# Patient Record
Sex: Female | Born: 1960 | Race: Black or African American | Hispanic: No | Marital: Single | Smoking: Never smoker
Health system: Southern US, Community
[De-identification: ages and names within clinical notes are randomized; demographics above are authoritative.]

## PROBLEM LIST (undated history)

## (undated) DIAGNOSIS — D509 Iron deficiency anemia, unspecified: Secondary | ICD-10-CM

## (undated) HISTORY — DX: Iron deficiency anemia, unspecified: D50.9

---

## 1998-12-18 ENCOUNTER — Other Ambulatory Visit: Admission: RE | Admit: 1998-12-18 | Discharge: 1998-12-18 | Payer: Self-pay | Admitting: Obstetrics and Gynecology

## 1999-02-15 ENCOUNTER — Ambulatory Visit (HOSPITAL_COMMUNITY): Admission: RE | Admit: 1999-02-15 | Discharge: 1999-02-15 | Payer: Self-pay | Admitting: Obstetrics and Gynecology

## 2000-01-13 ENCOUNTER — Other Ambulatory Visit: Admission: RE | Admit: 2000-01-13 | Discharge: 2000-01-13 | Payer: Self-pay | Admitting: Obstetrics and Gynecology

## 2001-02-07 ENCOUNTER — Other Ambulatory Visit: Admission: RE | Admit: 2001-02-07 | Discharge: 2001-02-07 | Payer: Self-pay | Admitting: Obstetrics and Gynecology

## 2001-06-20 ENCOUNTER — Ambulatory Visit (HOSPITAL_COMMUNITY): Admission: RE | Admit: 2001-06-20 | Discharge: 2001-06-20 | Payer: Self-pay | Admitting: Internal Medicine

## 2001-06-20 ENCOUNTER — Encounter: Payer: Self-pay | Admitting: Internal Medicine

## 2002-03-26 ENCOUNTER — Other Ambulatory Visit: Admission: RE | Admit: 2002-03-26 | Discharge: 2002-03-26 | Payer: Self-pay | Admitting: Obstetrics and Gynecology

## 2003-04-24 ENCOUNTER — Other Ambulatory Visit: Admission: RE | Admit: 2003-04-24 | Discharge: 2003-04-24 | Payer: Self-pay | Admitting: Obstetrics and Gynecology

## 2004-07-09 ENCOUNTER — Other Ambulatory Visit: Admission: RE | Admit: 2004-07-09 | Discharge: 2004-07-09 | Payer: Self-pay | Admitting: Obstetrics and Gynecology

## 2012-02-10 ENCOUNTER — Telehealth: Payer: Self-pay | Admitting: Hematology and Oncology

## 2012-02-10 NOTE — Telephone Encounter (Signed)
C/D 02/10/12 for appt. 02/14/12

## 2012-02-10 NOTE — Telephone Encounter (Signed)
S/W pt in re NP appt 10/08 @ 1 w/ Dr. Dalene Carrow.  Referring Dr. Richarda Overlie Dx-Elevate and treat anemia NP emailed.

## 2012-02-14 ENCOUNTER — Ambulatory Visit (HOSPITAL_BASED_OUTPATIENT_CLINIC_OR_DEPARTMENT_OTHER): Payer: PRIVATE HEALTH INSURANCE | Admitting: Hematology and Oncology

## 2012-02-14 ENCOUNTER — Ambulatory Visit: Payer: PRIVATE HEALTH INSURANCE

## 2012-02-14 ENCOUNTER — Encounter: Payer: Self-pay | Admitting: Hematology and Oncology

## 2012-02-14 ENCOUNTER — Ambulatory Visit (HOSPITAL_BASED_OUTPATIENT_CLINIC_OR_DEPARTMENT_OTHER): Payer: PRIVATE HEALTH INSURANCE | Admitting: Lab

## 2012-02-14 VITALS — BP 146/94 | HR 61 | Temp 97.9°F | Resp 20 | Ht 64.0 in | Wt 212.8 lb

## 2012-02-14 DIAGNOSIS — D649 Anemia, unspecified: Secondary | ICD-10-CM

## 2012-02-14 DIAGNOSIS — D539 Nutritional anemia, unspecified: Secondary | ICD-10-CM

## 2012-02-14 DIAGNOSIS — N920 Excessive and frequent menstruation with regular cycle: Secondary | ICD-10-CM

## 2012-02-14 LAB — URINALYSIS, MICROSCOPIC - CHCC
Glucose: NEGATIVE g/dL
Leukocyte Esterase: NEGATIVE
Nitrite: NEGATIVE
Specific Gravity, Urine: 1.005 (ref 1.003–1.035)

## 2012-02-14 LAB — CBC & DIFF AND RETIC
BASO%: 0.4 % (ref 0.0–2.0)
EOS%: 1.3 % (ref 0.0–7.0)
Eosinophils Absolute: 0.1 10*3/uL (ref 0.0–0.5)
LYMPH%: 42.2 % (ref 14.0–49.7)
MCH: 22.3 pg — ABNORMAL LOW (ref 25.1–34.0)
MCHC: 30.9 g/dL — ABNORMAL LOW (ref 31.5–36.0)
MCV: 72.3 fL — ABNORMAL LOW (ref 79.5–101.0)
MONO%: 8.1 % (ref 0.0–14.0)
NEUT#: 3.9 10*3/uL (ref 1.5–6.5)
RBC: 4.12 10*6/uL (ref 3.70–5.45)
RDW: 15.3 % — ABNORMAL HIGH (ref 11.2–14.5)
Retic %: 1.61 % (ref 0.70–2.10)
nRBC: 0 % (ref 0–0)

## 2012-02-14 LAB — COMPREHENSIVE METABOLIC PANEL (CC13)
ALT: 11 U/L (ref 0–55)
AST: 21 U/L (ref 5–34)
Albumin: 3.7 g/dL (ref 3.5–5.0)
Alkaline Phosphatase: 72 U/L (ref 40–150)
Potassium: 3.6 mEq/L (ref 3.5–5.1)
Sodium: 137 mEq/L (ref 136–145)
Total Bilirubin: 0.3 mg/dL (ref 0.20–1.20)
Total Protein: 7.4 g/dL (ref 6.4–8.3)

## 2012-02-14 LAB — MORPHOLOGY

## 2012-02-14 NOTE — Patient Instructions (Addendum)
Marissa Martin  [id number]   [facility] - AFTER VISIT SUMMARY   **RECOMMENDATIONS MADE BY THE CONSULTANT AND ANY TEST    RESULTS WILL BE SENT TO YOUR REFERRING DOCTORS.   YOUR EXAM FINDINGS, LABS AND RESULTS WERE DISCUSSED BY YOUR MD TODAY.  YOU CAN GO TO THE [facility] WEB SITE FOR INSTRUCTIONS ON HOW TO ASSESS MY CHART FOR ADDITIONAL INFORMATION AS NEEDED.  Your Updated drug allergies are: Allergies as of 02/14/2012  . (Not on File)    Your current list of medications are: No current outpatient prescriptions on file.     INSTRUCTIONS GIVEN AND DISCUSSED:  See attached schedule   SPECIAL INSTRUCTIONS/FOLLOW-UP:  See above.  I acknowledge that I have been informed and understand all the instructions given to me and received a copy.I know to contact the clinic, my physician, or go to the emergency Department if any problems should occur.   I do not have any more questions at this time, but understand that I may call the [facility] at [phone] during business hours should I have any further questions or need assistance in obtaining follow-up care.

## 2012-02-14 NOTE — Progress Notes (Signed)
This office note has been dictated.

## 2012-02-14 NOTE — Progress Notes (Signed)
CC:   Marissa Martin. Marcelle Overlie, M.D.  IDENTIFYING STATEMENT:  The patient is a 51 year old woman seen at request of Dr. Marcelle Overlie with anemia.  HISTORY OF PRESENT ILLNESS:  The patient reports a history of menorrhagia.  A few years ago she underwent a myomectomy for removal of fibroids.  She reports heavy menses.  As a result of this, she was diagnosed with iron-deficiency anemia some years back.  She has taken over-the-counter iron on and off over the years.  More of late, she has had a tendency to noncompliance due to constipation.  She denies a history for bruising or bleeding.  She reports a well- balanced diet and, uses a number of herbal remedies.  She denies rectal bleeding.  She has never received a colonoscopy, but Dr. Marcelle Overlie has her scheduled to see Dr. Ewing Schlein later this week for a screening colonoscopy.  Her maternal grandmother had colon cancer.  She has not lost any weight.  She has never received a blood transfusion or IV iron.  CBC from Dr. Dennie Bible office on 01/31/2012 notes a white cell count of 8, hemoglobin 9.1, hematocrit 28.5, platelets 213. 12/06/2010, white count 4.5, hemoglobin 9.2, hematocrit 29.5, platelets 307.    PAST MEDICAL HISTORY:  History of fibroids with menorrhagia, status post myomectomy.  No known drug allergies.  MEDICATIONS:  OTC iron and numerous herbal remedies which are non formulary.  SOCIAL HISTORY:  She does not smoke.  Drinks alcohol regularly.  She is single, has no children.  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  FAMILY HISTORY:  Her maternal grandmother had colon cancer.  Her maternal grandfather may have had lung cancer.  HEALTH MAINTENANCE:  She does not have a primary care physician.  REVIEW OF SYSTEMS:  Denies fever, chills, night sweats, anorexia, weight loss.  Notes fatigue, more so than usual over the last and month or so. GI:  Denies nausea, vomiting, abdominal pain, diarrhea, melena, hematochezia.  GU:  Denies  dysuria, hematuria, nocturia or frequency. Cardiovascular:  Denies chest pain, PND, orthopnea, ankle swelling. Respirations:  Denies cough, hemoptysis, wheeze, shortness of breath. Skin:  No bruising or bleeding.  Neurologic:  Denies headaches, vision change, extremity weakness.  Rest of review of systems negative except for above.  PHYSICAL EXAMINATION:  The patient is a well-appearing, well-nourished woman in no distress.  Vitals:  Pulse 61, blood pressure 156/94, temperature 97.9, respirations 20, weight 212.8 pounds.  HEENT:  Head is atraumatic, normocephalic.  Sclerae are anicteric.  Pupils equal, round and reactive to light.  Mouth:  Moist without ulcerations, thrush or lesions.  Neck:  Supple without adenopathy.  Chest:  Clear to both percussion and auscultation.  Cardiovascular:  First and second heart sounds present.  No added sounds or murmurs.  Abdomen:  Soft.  No hepatosplenomegaly.  Bowel sounds present.  Extremities:  No calf tenderness.  No edema.  Lymph nodes:  No adenopathy.  CNS:  Nonfocal.  IMPRESSION AND PLAN:  Ms. Marissa Martin is a pleasant 51 year old woman, who appears to have anemia likely as a result of menorrhagia.  She has a history of fibroids.  I would like for her to return to the lab to perform an in-depth anemia workup.  We will have her repeat a CBC, and we will review morphology.  In addition to that, we will look at her differential and obtain iron studies including ferritin, TIBC, vitamin B12 and retic count.  We will also obtain a urinalysis to rule out hematuria.  I also recommend a hemoglobin electrophoresis to make sure she does not carry the sickle cell trait.  Would also recommend she obtain a von Willebrand panel.  She has history of heavy menses.  Iron has a tendency to constipate her.  Thus, it would be reasonable to treat her with IV iron in the form of Feraheme initially and then prescription iron thereafter to improve her iron stores if it is  confirmed her ferritin levels are low.  If confirmed low, she returns later on this week to  receive IV iron.  We spent some time discussing the logistics and side effects  of therapy.  She is willing to proceed.  She follows up in 4 months'  time with labs.  I spent more than half the time coordinating care.    ______________________________ Laurice Record, M.D. LIO/MEDQ  D:  02/14/2012  T:  02/14/2012  Job:  [id number]

## 2012-02-15 ENCOUNTER — Telehealth: Payer: Self-pay | Admitting: Nurse Practitioner

## 2012-02-15 NOTE — Telephone Encounter (Signed)
Spoke with patient- informed per Dr. Dalene Carrow- that ferritin is very low.  Proceed with faraheme as scheduled.  Pt verbalized understanding.

## 2012-02-16 LAB — VON WILLEBRAND PANEL
Coagulation Factor VIII: 222 % — ABNORMAL HIGH (ref 73–140)
Ristocetin Co-factor, Plasma: 145 % (ref 42–200)

## 2012-02-16 LAB — IRON AND TIBC: UIBC: 516 ug/dL — ABNORMAL HIGH (ref 125–400)

## 2012-02-16 LAB — HAPTOGLOBIN: Haptoglobin: 163 mg/dL (ref 45–215)

## 2012-02-16 LAB — VITAMIN B12: Vitamin B-12: 468 pg/mL (ref 211–911)

## 2012-02-16 LAB — DIRECT ANTIGLOBULIN TEST (NOT AT ARMC): DAT IgG: NEGATIVE

## 2012-02-17 ENCOUNTER — Ambulatory Visit (HOSPITAL_BASED_OUTPATIENT_CLINIC_OR_DEPARTMENT_OTHER): Payer: PRIVATE HEALTH INSURANCE

## 2012-02-17 VITALS — BP 138/76 | HR 86 | Temp 98.4°F | Resp 20

## 2012-02-17 DIAGNOSIS — D649 Anemia, unspecified: Secondary | ICD-10-CM

## 2012-02-17 DIAGNOSIS — D539 Nutritional anemia, unspecified: Secondary | ICD-10-CM

## 2012-02-17 MED ORDER — SODIUM CHLORIDE 0.9 % IV SOLN
1020.0000 mg | Freq: Once | INTRAVENOUS | Status: AC
  Administered 2012-02-17: 1020 mg via INTRAVENOUS
  Filled 2012-02-17: qty 34

## 2012-02-17 MED ORDER — SODIUM CHLORIDE 0.9 % IV SOLN
Freq: Once | INTRAVENOUS | Status: AC
  Administered 2012-02-17: 12:00:00 via INTRAVENOUS

## 2012-02-17 NOTE — Progress Notes (Signed)
Pt given samples of iron from dr Ewing Schlein to try. 1) integra 125mg )   2) fusion plus. Pt is to try these to see if they are easier on her system.  dmr

## 2012-02-17 NOTE — Patient Instructions (Addendum)
[facility] Discharge Instructions   Today you received the following  agents feraheme    If you develop nausea and vomiting that is not controlled by your nausea medication, call the clinic. If it is after clinic hours your family physician or the after hours number for the clinic or go to the Emergency Department.   BELOW ARE SYMPTOMS THAT SHOULD BE REPORTED IMMEDIATELY:  *FEVER GREATER THAN 100.5 F  *CHILLS WITH OR WITHOUT FEVER  NAUSEA AND VOMITING THAT IS NOT CONTROLLED WITH YOUR NAUSEA MEDICATION  *UNUSUAL SHORTNESS OF BREATH  *UNUSUAL BRUISING OR BLEEDING  TENDERNESS IN MOUTH AND THROAT WITH OR WITHOUT PRESENCE OF ULCERS  *URINARY PROBLEMS  *BOWEL PROBLEMS  UNUSUAL RASH Items with * indicate a potential emergency and should be followed up as soon as possible.  One of the nurses will contact you 24 hours after your treatment. Please let the nurse know about any problems that you may have experienced. Feel free to call the clinic you have any questions or concerns. The clinic phone number is [phone].   I have been informed and understand all the instructions given to me. I know to contact the clinic, my physician, or go to the Emergency Department if any problems should occur. I do not have any questions at this time, but understand that I may call the clinic during office hours   should I have any questions or need assistance in obtaining follow up care.    __________________________________________  _____________  __________ Signature of Patient or Authorized Representative            Date                   Time    __________________________________________ Nurse's Signature

## 2012-02-21 ENCOUNTER — Other Ambulatory Visit: Payer: Self-pay | Admitting: *Deleted

## 2012-02-21 ENCOUNTER — Telehealth: Payer: Self-pay | Admitting: *Deleted

## 2012-02-21 NOTE — Telephone Encounter (Signed)
Received message from pt wanting to know about iron pill.   Spoke with pt and was informed that pt was given Integra 125 mg  And  Infusion  Plus  Samples by Dr. Ewing Schlein at her consultation appt on 02/17/12.   Pt wanted to know if it is ok with Dr. Dalene Carrow for pt to take these iron pills.   Pt was concerned about interfering with Feraheme infusion. Pt's  Phone   [phone].

## 2012-02-21 NOTE — Telephone Encounter (Signed)
Called pt at home and instructed pt re:  Per Dr. Dalene Carrow,  OK for pt to take iron meds given by Dr. Ewing Schlein.   Instructed pt to also eat prunes , drink prune juice as tolerated to help with bowel issue when taking iron pills.  Pt voiced understanding.

## 2012-04-28 ENCOUNTER — Telehealth: Payer: Self-pay | Admitting: Oncology

## 2012-04-28 NOTE — Telephone Encounter (Signed)
lvm for pt regarding to r/s appt....former pt of Dr. Marton Redwood assigned to G.

## 2012-04-30 ENCOUNTER — Telehealth: Payer: Self-pay | Admitting: Oncology

## 2012-04-30 NOTE — Telephone Encounter (Signed)
pt returned call i advised of chenge in physician....pt ok....former Dr. Marton Redwood assigned to Dr. Reece Agar.

## 2012-05-12 ENCOUNTER — Telehealth: Payer: Self-pay | Admitting: Oncology

## 2012-05-12 ENCOUNTER — Encounter: Payer: Self-pay | Admitting: Oncology

## 2012-05-12 NOTE — Telephone Encounter (Signed)
According to documentation pt has been contacted , letter has been printed but not sent

## 2012-05-30 ENCOUNTER — Telehealth: Payer: Self-pay | Admitting: Oncology

## 2012-05-30 NOTE — Telephone Encounter (Signed)
Called pt and left message, pt a former Dr. Dalene Carrow r/s from 2/11 by MD , pt will see Dr. Truett Perna on 07/17/12 lab on 07/12/12

## 2012-06-01 ENCOUNTER — Telehealth: Payer: Self-pay | Admitting: Oncology

## 2012-06-01 NOTE — Telephone Encounter (Signed)
Called pt, apologized for moving appt to another MD per POF dated 1/22 on another patient . Pt will be seen on 2/14 lab then see md on 2/18//14.

## 2012-06-12 ENCOUNTER — Telehealth: Payer: Self-pay | Admitting: Oncology

## 2012-06-12 NOTE — Telephone Encounter (Signed)
Pt called , gave her appt for lab on 2/14 and see Dr. Cyndie Chime on 2/18, a former Dr. Dalene Carrow patien

## 2012-06-14 ENCOUNTER — Other Ambulatory Visit: Payer: PRIVATE HEALTH INSURANCE | Admitting: Lab

## 2012-06-19 ENCOUNTER — Ambulatory Visit: Payer: PRIVATE HEALTH INSURANCE | Admitting: Nurse Practitioner

## 2012-06-19 ENCOUNTER — Ambulatory Visit: Payer: PRIVATE HEALTH INSURANCE | Admitting: Hematology and Oncology

## 2012-06-22 ENCOUNTER — Other Ambulatory Visit: Payer: Self-pay | Admitting: Oncology

## 2012-06-22 ENCOUNTER — Other Ambulatory Visit (HOSPITAL_BASED_OUTPATIENT_CLINIC_OR_DEPARTMENT_OTHER): Payer: BC Managed Care – PPO | Admitting: Lab

## 2012-06-22 ENCOUNTER — Telehealth: Payer: Self-pay | Admitting: Oncology

## 2012-06-22 DIAGNOSIS — N920 Excessive and frequent menstruation with regular cycle: Secondary | ICD-10-CM

## 2012-06-22 DIAGNOSIS — D539 Nutritional anemia, unspecified: Secondary | ICD-10-CM

## 2012-06-22 LAB — CBC & DIFF AND RETIC
Basophils Absolute: 0 10*3/uL (ref 0.0–0.1)
EOS%: 1.5 % (ref 0.0–7.0)
HGB: 12.1 g/dL (ref 11.6–15.9)
MCH: 28.9 pg (ref 25.1–34.0)
MCHC: 34.2 g/dL (ref 31.5–36.0)
MCV: 84.7 fL (ref 79.5–101.0)
MONO%: 9.7 % (ref 0.0–14.0)
RDW: 12.8 % (ref 11.2–14.5)
Retic Ct Abs: 47.23 10*3/uL (ref 33.70–90.70)

## 2012-06-22 LAB — IRON AND TIBC
%SAT: 12 % — ABNORMAL LOW (ref 20–55)
TIBC: 346 ug/dL (ref 250–470)

## 2012-06-22 NOTE — Telephone Encounter (Signed)
Called pt reminded her of appt on 2/18 MD only

## 2012-06-26 ENCOUNTER — Ambulatory Visit (HOSPITAL_BASED_OUTPATIENT_CLINIC_OR_DEPARTMENT_OTHER): Payer: BC Managed Care – PPO | Admitting: Oncology

## 2012-06-26 VITALS — BP 153/87 | HR 64 | Temp 97.6°F | Resp 20 | Ht 64.0 in | Wt 209.1 lb

## 2012-06-26 DIAGNOSIS — D539 Nutritional anemia, unspecified: Secondary | ICD-10-CM

## 2012-06-26 NOTE — Progress Notes (Signed)
Hematology and Oncology Follow Up Visit  Marissa Martin [id number] April 07, 1961 52 y.o. 06/26/2012 7:17 PM   Principle Diagnosis: Encounter Diagnosis  Name Primary?  Marland Kitchen Unspecified deficiency anemia Yes     Interim History:  This is a pleasant 52 year old woman followed by Dr. Dalene Carrow. She was evaluated here back in October 2013 for iron deficiency anemia related to menorrhagia. She did have a previous myomectomy for fibroids but a number of residual fibroids are growing again causing recurrent menorrhagia. On February 14, 2012, hemoglobin 9.2, MCV 72, reticulocyte count 1.6%, serum iron 11, TIBC 527, percent saturation 2, ferritin 4. A number of ancillary studies were done. Urinalysis showed trace positive blood but only 0-2 red cells per high power field. Renal function normal with BUN 15, creatinine 1.0. B12 normal for 68, folic acid greater than 20. Coombs direct and indirect tests were negative. Haptoglobin normal at 163. A von Willebrand profile was normal and in fact showed evidence of acute phase reaction with elevated factor VIII clotting activity 222% of control, elevated von Willebrand antigen 218%, and normal ristocetin cofactor 145% of control.  She was given a dose of parenteral iron 1020 mg on 02/17/2012. She had a nice response. Lab done last week in anticipation of today's visit on 06/22/2012 now shows serum iron 42, TIBC 346, ferritin 12. Hemoglobin is up to 12.1. MCV 85.  Medications: reviewed. Of note she takes numerous vitamin and nutritional supplements. She takes no regular medications.  Allergies: Not on File  Review of Systems: Constitutional:   No constitutional symptoms Respiratory: No cough or dyspnea Cardiovascular:  No chest pain or palpitations Gastrointestinal: No hematochezia or melena Genito-Urinary: No hematuria Musculoskeletal: No musculoskeletal complaints Neurologic: No headache or change in vision Skin: No rash or ecchymosis Remaining ROS  negative.  Physical Exam: Blood pressure 153/87, pulse 64, temperature 97.6 F (36.4 C), temperature source Oral, resp. rate 20, height 5\' 4"  (1.626 m), weight 209 lb 1.6 oz (94.847 kg). Wt Readings from Last 3 Encounters:  06/26/12 209 lb 1.6 oz (94.847 kg)  02/14/12 212 lb 12.8 oz (96.525 kg)     General appearance: Healthy appearing African American woman HENNT: Pharynx no erythema or exudate Lymph nodes: No adenopathy Breasts: Lungs: Clear to auscultation resonant to percussion Heart: Regular rhythm no murmur Abdomen: Soft, nontender, no mass, no organomegaly Extremities: No edema, no calf tenderness Vascular: No cyanosis Neurologic: No focal deficit Skin: No rash or ecchymosis  Lab Results: Lab Results  Component Value Date   WBC 4.5 06/22/2012   HGB 12.1 06/22/2012   HCT 35.4 06/22/2012   MCV 84.7 06/22/2012   PLT 237 06/22/2012     Chemistry      Component Value Date/Time   NA 137 02/14/2012 1503   K 3.6 02/14/2012 1503   CL 105 02/14/2012 1503   CO2 21* 02/14/2012 1503   BUN 15.0 02/14/2012 1503   CREATININE 1.0 02/14/2012 1503      Component Value Date/Time   CALCIUM 9.1 02/14/2012 1503   ALKPHOS 72 02/14/2012 1503   AST 21 02/14/2012 1503   ALT 11 02/14/2012 1503   BILITOT 0.30 02/14/2012 1503      Impression and Plan: Simple iron deficiency due to menorrhagia Near-complete response to single dose of parenteral iron.  I told her at this point she does not need to get any more parenteral iron but should take a oral iron preparation on a daily basis. I gave her a prescription for Niferex 150 mg twice  daily.  I did not schedule a formal revisit to this office. I would be happy to see her again in the future if the need arises.   CC:. Dr. Richarda Overlie   Levert Feinstein, MD 2/18/20147:17 PM

## 2012-07-12 ENCOUNTER — Other Ambulatory Visit: Payer: PRIVATE HEALTH INSURANCE

## 2012-07-16 ENCOUNTER — Ambulatory Visit: Payer: PRIVATE HEALTH INSURANCE | Admitting: Oncology

## 2012-07-17 ENCOUNTER — Ambulatory Visit: Payer: PRIVATE HEALTH INSURANCE | Admitting: Nurse Practitioner

## 2012-07-20 ENCOUNTER — Other Ambulatory Visit: Payer: PRIVATE HEALTH INSURANCE

## 2012-08-15 ENCOUNTER — Telehealth: Payer: Self-pay | Admitting: *Deleted

## 2012-08-15 NOTE — Telephone Encounter (Signed)
Patient called.  The Niferex 150 that Dr. Cyndie Chime wrote for her 06-26-12 is not covered by her insurance.  It cost $80/month and is too expensive.  Suggested she check with the pharmacist and see what would be close that the insurance would cover and then she can relay that to [place].  She will check and then give [place] a call back.  She has been finishing up some samples from Dr.Magod.

## 2012-08-29 ENCOUNTER — Other Ambulatory Visit: Payer: Self-pay | Admitting: *Deleted

## 2012-08-29 DIAGNOSIS — D509 Iron deficiency anemia, unspecified: Secondary | ICD-10-CM

## 2012-08-29 MED ORDER — POLYSACCHARIDE IRON COMPLEX 150 MG PO CAPS: 150.0000 mg | ORAL_CAPSULE | Freq: Two times a day (BID) | ORAL | Status: AC

## 2013-02-28 ENCOUNTER — Other Ambulatory Visit: Payer: Self-pay | Admitting: Internal Medicine

## 2013-02-28 DIAGNOSIS — E010 Iodine-deficiency related diffuse (endemic) goiter: Secondary | ICD-10-CM

## 2013-03-06 ENCOUNTER — Ambulatory Visit
Admission: RE | Admit: 2013-03-06 | Discharge: 2013-03-06 | Disposition: A | Discharge status: Home / Self Care | Payer: BC Managed Care – PPO | Source: Ambulatory Visit | Attending: Internal Medicine | Admitting: Internal Medicine

## 2013-03-06 DIAGNOSIS — E010 Iodine-deficiency related diffuse (endemic) goiter: Secondary | ICD-10-CM

## 2013-03-18 ENCOUNTER — Other Ambulatory Visit: Payer: Self-pay | Admitting: Internal Medicine

## 2013-03-18 DIAGNOSIS — E041 Nontoxic single thyroid nodule: Secondary | ICD-10-CM

## 2013-03-26 ENCOUNTER — Ambulatory Visit
Admission: RE | Admit: 2013-03-26 | Discharge: 2013-03-26 | Disposition: A | Discharge status: Home / Self Care | Payer: BC Managed Care – PPO | Source: Ambulatory Visit | Attending: Internal Medicine | Admitting: Internal Medicine

## 2013-03-26 ENCOUNTER — Other Ambulatory Visit (HOSPITAL_COMMUNITY)
Admission: RE | Admit: 2013-03-26 | Discharge: 2013-03-26 | Disposition: A | Discharge status: Home / Self Care | Payer: BC Managed Care – PPO | Source: Ambulatory Visit | Attending: Interventional Radiology | Admitting: Interventional Radiology

## 2013-03-26 DIAGNOSIS — E041 Nontoxic single thyroid nodule: Secondary | ICD-10-CM

## 2013-08-02 ENCOUNTER — Telehealth: Payer: Self-pay | Admitting: Internal Medicine

## 2013-08-02 NOTE — Telephone Encounter (Signed)
FORMER PATIENT OF DR. GRANFORTUNA REASSIGNED TO DR. CHISM  °

## 2013-12-30 ENCOUNTER — Other Ambulatory Visit: Payer: Self-pay | Admitting: Obstetrics and Gynecology

## 2013-12-31 LAB — CYTOLOGY - PAP

## 2015-03-31 ENCOUNTER — Other Ambulatory Visit: Payer: Self-pay | Admitting: Registered Nurse

## 2016-09-01 ENCOUNTER — Other Ambulatory Visit: Payer: Self-pay | Admitting: Obstetrics and Gynecology

## 2016-09-01 DIAGNOSIS — R928 Other abnormal and inconclusive findings on diagnostic imaging of breast: Secondary | ICD-10-CM

## 2016-09-07 ENCOUNTER — Ambulatory Visit
Admission: RE | Admit: 2016-09-07 | Discharge: 2016-09-07 | Disposition: A | Discharge status: Home / Self Care | Payer: Self-pay | Source: Ambulatory Visit | Attending: Obstetrics and Gynecology | Admitting: Obstetrics and Gynecology

## 2016-09-07 ENCOUNTER — Other Ambulatory Visit: Payer: Self-pay | Admitting: Obstetrics and Gynecology

## 2016-09-07 DIAGNOSIS — R928 Other abnormal and inconclusive findings on diagnostic imaging of breast: Secondary | ICD-10-CM

## 2016-09-08 ENCOUNTER — Other Ambulatory Visit: Payer: Self-pay | Admitting: Obstetrics and Gynecology

## 2016-09-08 DIAGNOSIS — R928 Other abnormal and inconclusive findings on diagnostic imaging of breast: Secondary | ICD-10-CM

## 2017-03-14 ENCOUNTER — Inpatient Hospital Stay
Admission: RE | Admit: 2017-03-14 | Discharge: 2017-03-14 | Disposition: A | Discharge status: Home / Self Care | Payer: Managed Care, Other (non HMO) | Source: Ambulatory Visit | Attending: Obstetrics and Gynecology | Admitting: Obstetrics and Gynecology

## 2017-03-14 ENCOUNTER — Other Ambulatory Visit: Payer: Managed Care, Other (non HMO)

## 2017-05-15 ENCOUNTER — Other Ambulatory Visit: Payer: Managed Care, Other (non HMO)

## 2017-05-18 ENCOUNTER — Other Ambulatory Visit: Payer: Self-pay | Admitting: Obstetrics and Gynecology

## 2017-05-18 ENCOUNTER — Ambulatory Visit
Admission: RE | Admit: 2017-05-18 | Discharge: 2017-05-18 | Disposition: A | Discharge status: Home / Self Care | Payer: Managed Care, Other (non HMO) | Source: Ambulatory Visit | Attending: Obstetrics and Gynecology | Admitting: Obstetrics and Gynecology

## 2017-05-18 DIAGNOSIS — Z1231 Encounter for screening mammogram for malignant neoplasm of breast: Secondary | ICD-10-CM

## 2017-05-18 DIAGNOSIS — R928 Other abnormal and inconclusive findings on diagnostic imaging of breast: Secondary | ICD-10-CM

## 2017-05-18 DIAGNOSIS — N6320 Unspecified lump in the left breast, unspecified quadrant: Secondary | ICD-10-CM

## 2017-05-30 ENCOUNTER — Other Ambulatory Visit: Payer: Self-pay | Admitting: Obstetrics and Gynecology

## 2017-05-30 DIAGNOSIS — N63 Unspecified lump in unspecified breast: Secondary | ICD-10-CM

## 2017-11-21 ENCOUNTER — Ambulatory Visit
Admission: RE | Admit: 2017-11-21 | Discharge: 2017-11-21 | Disposition: A | Discharge status: Home / Self Care | Payer: Managed Care, Other (non HMO) | Source: Ambulatory Visit | Attending: Obstetrics and Gynecology | Admitting: Obstetrics and Gynecology

## 2017-11-21 ENCOUNTER — Other Ambulatory Visit: Payer: Managed Care, Other (non HMO)

## 2017-11-21 DIAGNOSIS — N63 Unspecified lump in unspecified breast: Secondary | ICD-10-CM

## 2017-11-21 DIAGNOSIS — N6320 Unspecified lump in the left breast, unspecified quadrant: Secondary | ICD-10-CM

## 2018-03-14 ENCOUNTER — Other Ambulatory Visit: Payer: Self-pay | Admitting: Internal Medicine

## 2018-03-14 DIAGNOSIS — Z8639 Personal history of other endocrine, nutritional and metabolic disease: Secondary | ICD-10-CM

## 2018-03-19 ENCOUNTER — Ambulatory Visit
Admission: RE | Admit: 2018-03-19 | Discharge: 2018-03-19 | Disposition: A | Discharge status: Home / Self Care | Payer: Managed Care, Other (non HMO) | Source: Ambulatory Visit | Attending: Internal Medicine | Admitting: Internal Medicine

## 2018-03-19 DIAGNOSIS — Z8639 Personal history of other endocrine, nutritional and metabolic disease: Secondary | ICD-10-CM

## 2018-09-26 DIAGNOSIS — R7989 Other specified abnormal findings of blood chemistry: Secondary | ICD-10-CM | POA: Diagnosis not present

## 2018-09-26 DIAGNOSIS — Z131 Encounter for screening for diabetes mellitus: Secondary | ICD-10-CM | POA: Diagnosis not present

## 2018-09-26 DIAGNOSIS — I10 Essential (primary) hypertension: Secondary | ICD-10-CM | POA: Diagnosis not present

## 2018-09-26 DIAGNOSIS — E559 Vitamin D deficiency, unspecified: Secondary | ICD-10-CM | POA: Diagnosis not present

## 2019-01-16 DIAGNOSIS — A6000 Herpesviral infection of urogenital system, unspecified: Secondary | ICD-10-CM | POA: Insufficient documentation

## 2019-01-21 ENCOUNTER — Other Ambulatory Visit: Payer: Self-pay | Admitting: Obstetrics and Gynecology

## 2019-01-21 DIAGNOSIS — N6320 Unspecified lump in the left breast, unspecified quadrant: Secondary | ICD-10-CM

## 2019-01-29 ENCOUNTER — Other Ambulatory Visit: Payer: Self-pay

## 2019-01-29 ENCOUNTER — Ambulatory Visit
Admission: RE | Admit: 2019-01-29 | Discharge: 2019-01-29 | Disposition: A | Discharge status: Home / Self Care | Payer: 59 | Source: Ambulatory Visit | Attending: Obstetrics and Gynecology | Admitting: Obstetrics and Gynecology

## 2019-01-29 ENCOUNTER — Ambulatory Visit
Admission: RE | Admit: 2019-01-29 | Discharge: 2019-01-29 | Disposition: A | Discharge status: Home / Self Care | Payer: Managed Care, Other (non HMO) | Source: Ambulatory Visit | Attending: Obstetrics and Gynecology | Admitting: Obstetrics and Gynecology

## 2019-01-29 DIAGNOSIS — N6320 Unspecified lump in the left breast, unspecified quadrant: Secondary | ICD-10-CM

## 2021-04-13 IMAGING — MG MM DIGITAL DIAGNOSTIC BILAT W/ TOMO W/ CAD
6 of 12 series · 6 of 36 positions shown · non-contrast
Comparison: Previous exam(s).

CLINICAL DATA: Final follow-up for a probably benign masses in the
left breast.

EXAM:
DIGITAL DIAGNOSTIC BILATERAL MAMMOGRAM WITH CAD AND TOMO
ULTRASOUND LEFT BREAST

[R CC synth-2D]
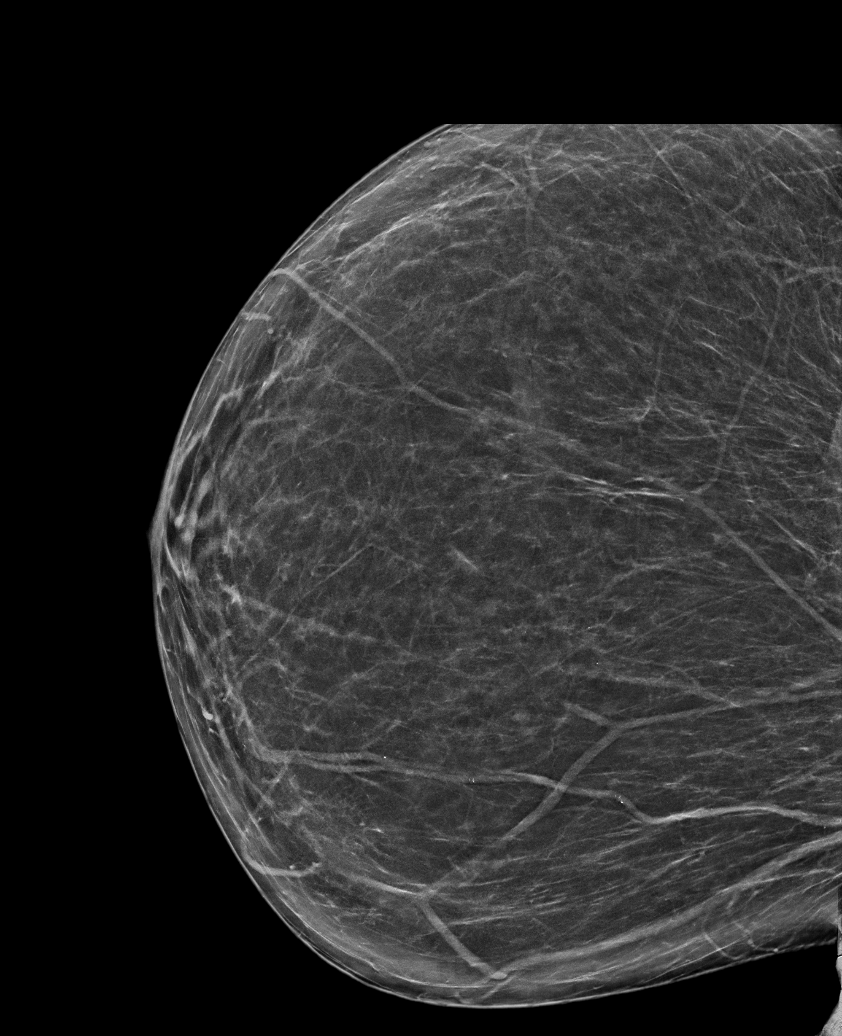

[R MLO synth-2D (1 of 2)]
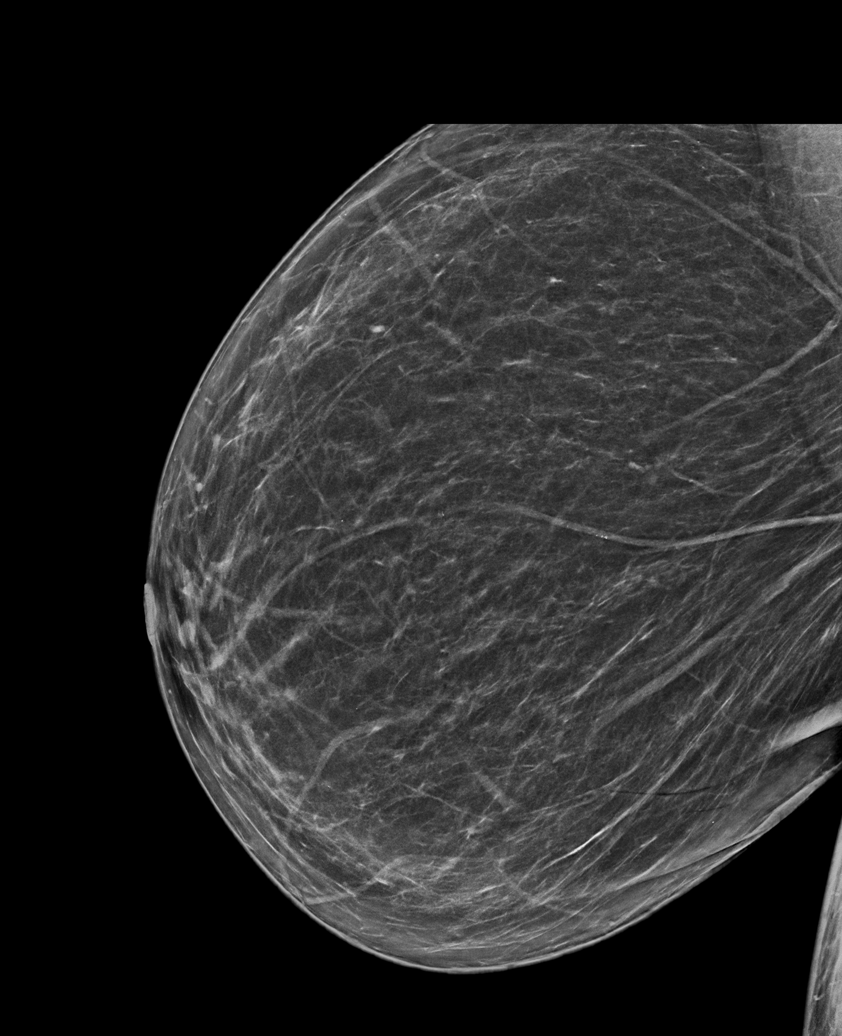

[L MLO synth-2D (1 of 2)]
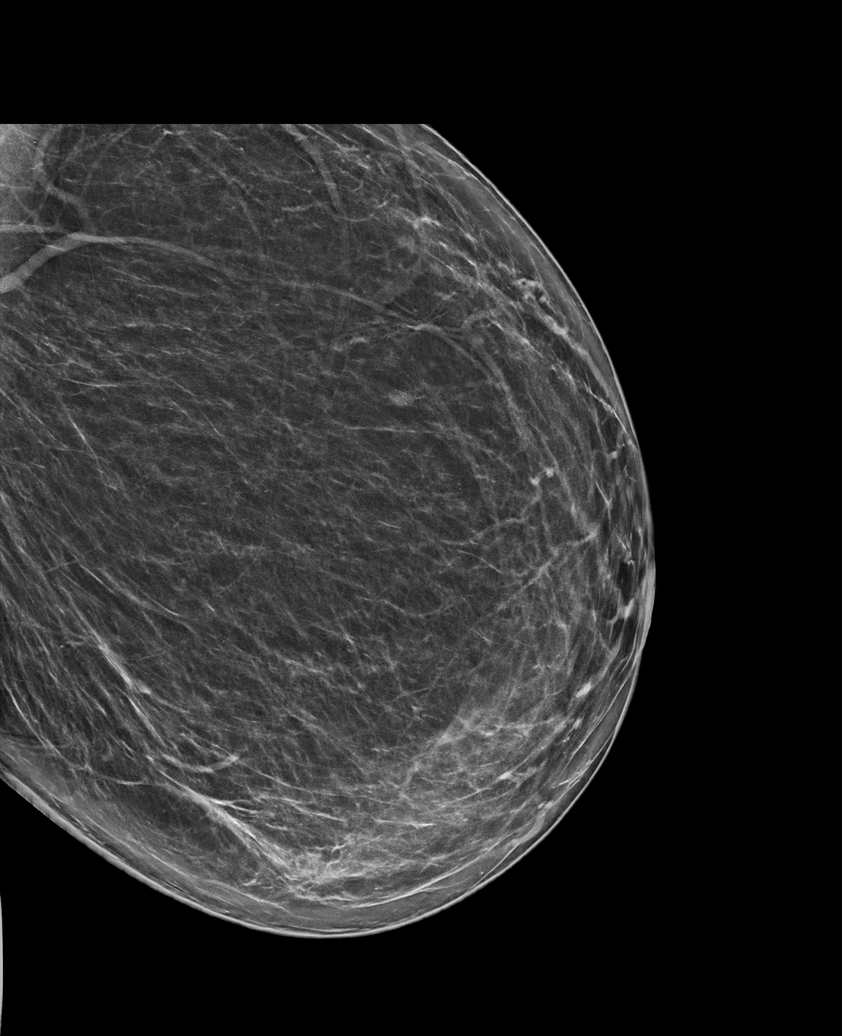

[R MLO synth-2D (2 of 2)]
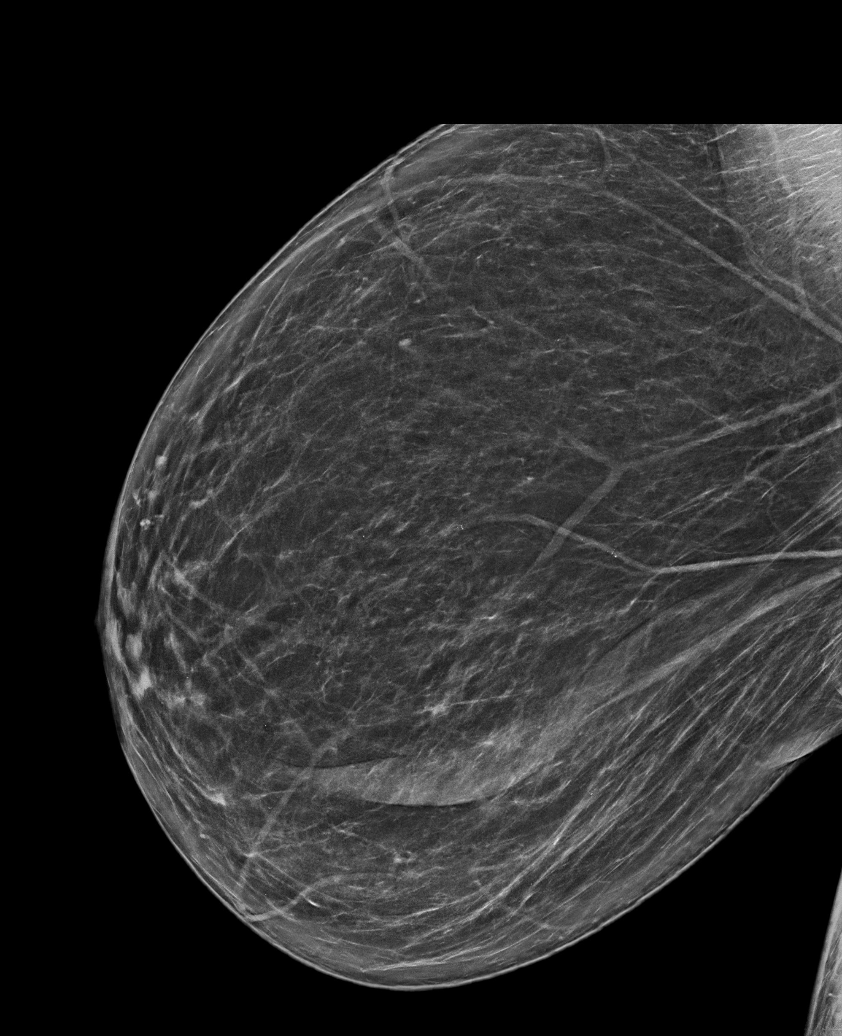

[L MLO synth-2D (2 of 2)]
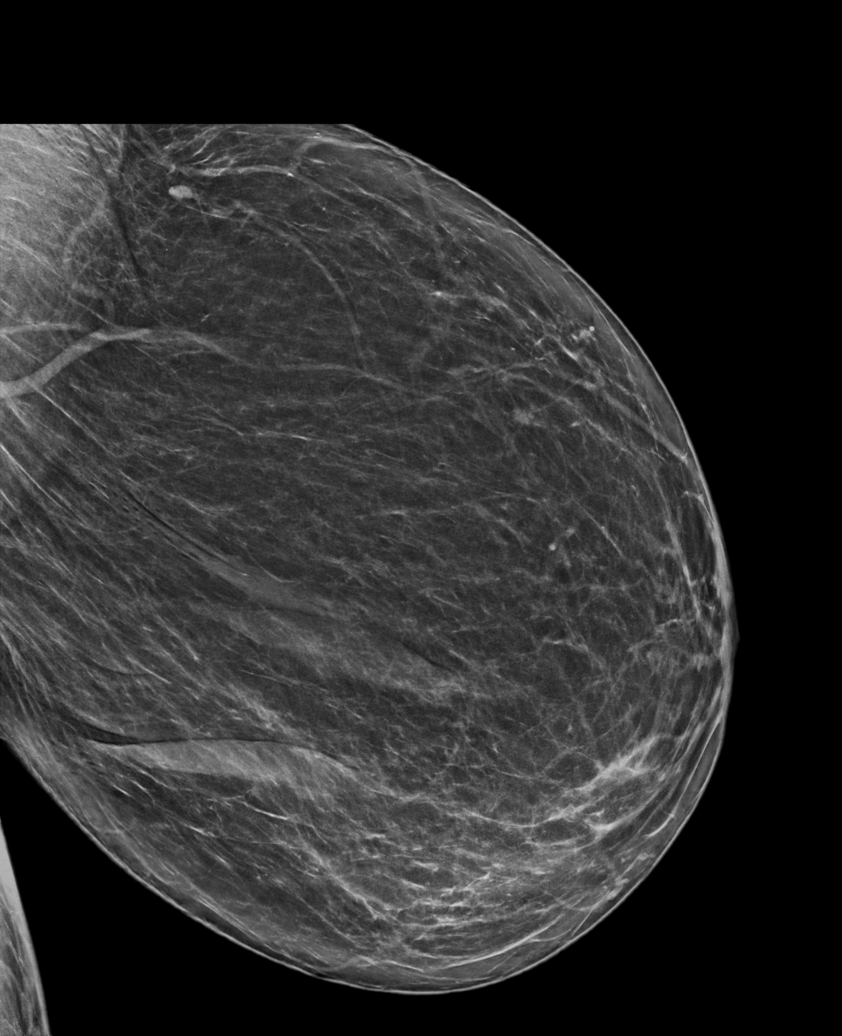

[L CC synth-2D]
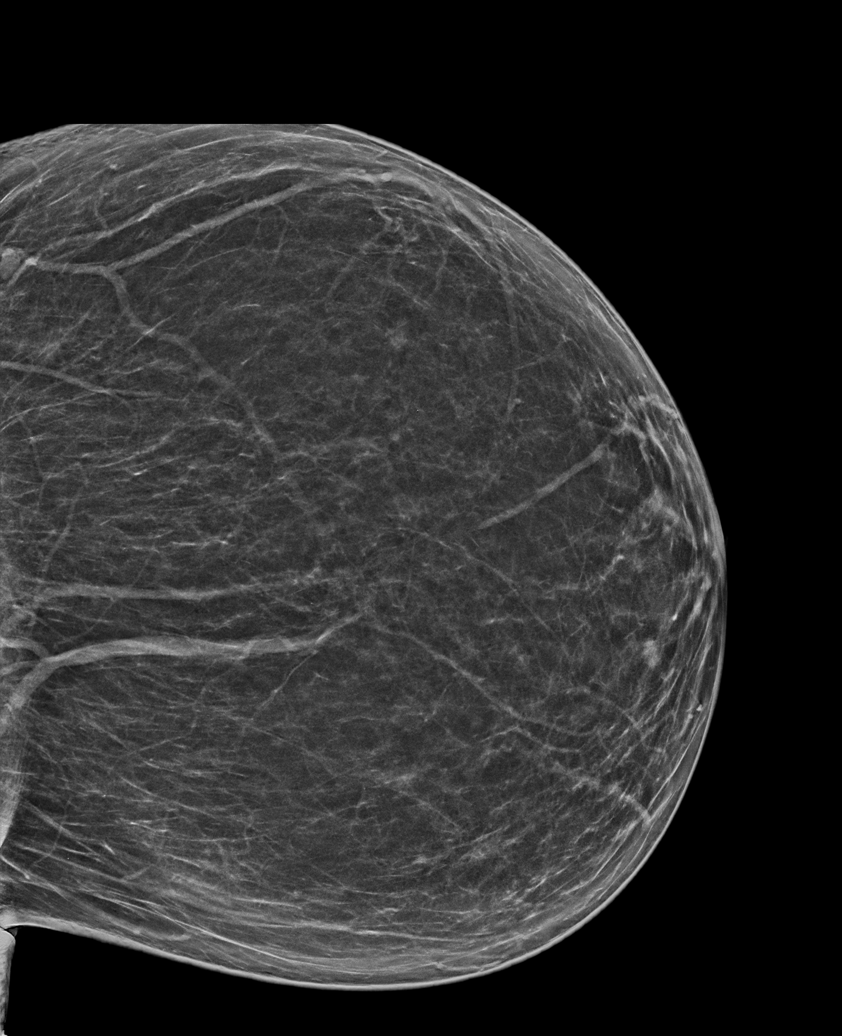

[6 of 36 positions shown; findings below may reference images not displayed]

ACR Breast Density Category b: There are scattered areas of
fibroglandular density.
FINDINGS: No suspicious calcifications, masses or areas of distortion are seen
in the bilateral breasts. The 2 small adjacent masses in the
upper-outer quadrant of the left breast are mammographically stable.

Mammographic images were processed with CAD.

Ultrasound of the left breast at [DATE], 7 cm from the nipple
demonstrates a stable hypoechoic mass measuring 5 x 2 x 3 mm,
previously 4 x 2 x 4 mm. The tiny adjacent 2 mm mass is also stable.
IMPRESSION: 1. The 2 masses in the upper-outer left breast have demonstrated 2
years of stability, and are therefore benign.

2.  No mammographic evidence of malignancy in the bilateral breasts.

RECOMMENDATION:
Screening mammogram in one year.(Code:[id number])

I have discussed the findings and recommendations with the patient.
If applicable, a reminder letter will be sent to the patient
regarding the next appointment.

BI-RADS CATEGORY  2: Benign.

## 2021-06-09 DIAGNOSIS — R1011 Right upper quadrant pain: Secondary | ICD-10-CM | POA: Diagnosis not present

## 2021-06-09 DIAGNOSIS — R0781 Pleurodynia: Secondary | ICD-10-CM | POA: Diagnosis not present

## 2021-06-10 ENCOUNTER — Other Ambulatory Visit: Payer: Self-pay | Admitting: Registered Nurse

## 2021-06-10 DIAGNOSIS — R1011 Right upper quadrant pain: Secondary | ICD-10-CM

## 2021-06-14 ENCOUNTER — Ambulatory Visit
Admission: RE | Admit: 2021-06-14 | Discharge: 2021-06-14 | Disposition: A | Discharge status: Home / Self Care | Payer: Self-pay | Source: Ambulatory Visit | Attending: Registered Nurse | Admitting: Registered Nurse

## 2021-06-14 ENCOUNTER — Other Ambulatory Visit: Payer: Self-pay

## 2021-06-14 DIAGNOSIS — R1011 Right upper quadrant pain: Secondary | ICD-10-CM

## 2021-06-14 DIAGNOSIS — I700 Atherosclerosis of aorta: Secondary | ICD-10-CM | POA: Diagnosis not present

## 2021-06-14 DIAGNOSIS — R109 Unspecified abdominal pain: Secondary | ICD-10-CM | POA: Diagnosis not present

## 2021-06-25 ENCOUNTER — Other Ambulatory Visit: Payer: Self-pay | Admitting: Internal Medicine

## 2021-06-25 DIAGNOSIS — R1011 Right upper quadrant pain: Secondary | ICD-10-CM

## 2021-07-16 ENCOUNTER — Ambulatory Visit
Admission: RE | Admit: 2021-07-16 | Discharge: 2021-07-16 | Disposition: A | Discharge status: Home / Self Care | Payer: BC Managed Care – PPO | Source: Ambulatory Visit | Attending: Internal Medicine | Admitting: Internal Medicine

## 2021-07-16 DIAGNOSIS — K5730 Diverticulosis of large intestine without perforation or abscess without bleeding: Secondary | ICD-10-CM | POA: Diagnosis not present

## 2021-07-16 DIAGNOSIS — R1011 Right upper quadrant pain: Secondary | ICD-10-CM

## 2021-07-16 DIAGNOSIS — K7689 Other specified diseases of liver: Secondary | ICD-10-CM | POA: Diagnosis not present

## 2021-07-16 DIAGNOSIS — M4317 Spondylolisthesis, lumbosacral region: Secondary | ICD-10-CM | POA: Diagnosis not present

## 2021-07-16 DIAGNOSIS — D259 Leiomyoma of uterus, unspecified: Secondary | ICD-10-CM | POA: Diagnosis not present

## 2021-07-16 MED ORDER — IOPAMIDOL (ISOVUE-300) INJECTION 61%
100.0000 mL | Freq: Once | INTRAVENOUS | Status: AC | PRN
  Administered 2021-07-16: 100 mL via INTRAVENOUS

## 2021-10-07 DIAGNOSIS — Z0000 Encounter for general adult medical examination without abnormal findings: Secondary | ICD-10-CM | POA: Diagnosis not present

## 2021-10-07 DIAGNOSIS — R7301 Impaired fasting glucose: Secondary | ICD-10-CM | POA: Diagnosis not present

## 2021-10-14 DIAGNOSIS — Z0000 Encounter for general adult medical examination without abnormal findings: Secondary | ICD-10-CM | POA: Diagnosis not present

## 2021-10-14 DIAGNOSIS — E7800 Pure hypercholesterolemia, unspecified: Secondary | ICD-10-CM | POA: Diagnosis not present

## 2022-01-07 DIAGNOSIS — E1159 Type 2 diabetes mellitus with other circulatory complications: Secondary | ICD-10-CM | POA: Diagnosis not present

## 2022-01-14 DIAGNOSIS — E7800 Pure hypercholesterolemia, unspecified: Secondary | ICD-10-CM | POA: Diagnosis not present

## 2022-01-14 DIAGNOSIS — E1159 Type 2 diabetes mellitus with other circulatory complications: Secondary | ICD-10-CM | POA: Diagnosis not present

## 2022-01-14 DIAGNOSIS — Z8249 Family history of ischemic heart disease and other diseases of the circulatory system: Secondary | ICD-10-CM | POA: Diagnosis not present

## 2022-01-14 DIAGNOSIS — I700 Atherosclerosis of aorta: Secondary | ICD-10-CM | POA: Diagnosis not present

## 2022-01-25 ENCOUNTER — Other Ambulatory Visit: Payer: Self-pay | Admitting: Registered Nurse

## 2022-01-25 ENCOUNTER — Other Ambulatory Visit (HOSPITAL_BASED_OUTPATIENT_CLINIC_OR_DEPARTMENT_OTHER): Payer: Self-pay | Admitting: Registered Nurse

## 2022-01-25 DIAGNOSIS — E1159 Type 2 diabetes mellitus with other circulatory complications: Secondary | ICD-10-CM

## 2022-03-01 ENCOUNTER — Ambulatory Visit (HOSPITAL_COMMUNITY)
Admission: RE | Admit: 2022-03-01 | Discharge: 2022-03-01 | Disposition: A | Discharge status: Home / Self Care | Payer: Self-pay | Source: Ambulatory Visit | Attending: Registered Nurse | Admitting: Registered Nurse

## 2022-03-01 DIAGNOSIS — E1159 Type 2 diabetes mellitus with other circulatory complications: Secondary | ICD-10-CM | POA: Insufficient documentation

## 2022-03-16 DIAGNOSIS — Z8249 Family history of ischemic heart disease and other diseases of the circulatory system: Secondary | ICD-10-CM | POA: Diagnosis not present

## 2022-03-16 DIAGNOSIS — E7800 Pure hypercholesterolemia, unspecified: Secondary | ICD-10-CM | POA: Diagnosis not present

## 2022-03-16 DIAGNOSIS — I700 Atherosclerosis of aorta: Secondary | ICD-10-CM | POA: Diagnosis not present

## 2022-03-16 DIAGNOSIS — I2510 Atherosclerotic heart disease of native coronary artery without angina pectoris: Secondary | ICD-10-CM | POA: Diagnosis not present

## 2022-05-13 DIAGNOSIS — E7800 Pure hypercholesterolemia, unspecified: Secondary | ICD-10-CM | POA: Diagnosis not present

## 2022-05-13 DIAGNOSIS — E1159 Type 2 diabetes mellitus with other circulatory complications: Secondary | ICD-10-CM | POA: Diagnosis not present

## 2022-05-19 ENCOUNTER — Ambulatory Visit: Payer: BC Managed Care – PPO | Attending: Internal Medicine | Admitting: Internal Medicine

## 2022-05-19 VITALS — BP 150/92 | HR 76 | Ht 63.0 in | Wt 243.4 lb

## 2022-05-19 DIAGNOSIS — E785 Hyperlipidemia, unspecified: Secondary | ICD-10-CM

## 2022-05-19 DIAGNOSIS — R011 Cardiac murmur, unspecified: Secondary | ICD-10-CM

## 2022-05-19 DIAGNOSIS — R931 Abnormal findings on diagnostic imaging of heart and coronary circulation: Secondary | ICD-10-CM | POA: Diagnosis not present

## 2022-05-19 MED ORDER — METOPROLOL TARTRATE 100 MG PO TABS: 100.0000 mg | ORAL_TABLET | Freq: Once | ORAL | 0 refills | Status: AC

## 2022-05-19 NOTE — Patient Instructions (Signed)
Medication Instructions:  Please take 100mg  of Metoprolol Tartrate 2hrs before CTA scan *If you need a refill on your cardiac medications before your next appointment, please call your pharmacy*   Lab Work: None If you have labs (blood work) drawn today and your tests are completely normal, you will receive your results only by: [place] (if you have MyChart) OR A paper copy in the mail If you have any lab test that is abnormal or we need to change your treatment, we will call you to review the results.   Testing/Procedures: Your physician has requested that you have cardiac CT. Cardiac computed tomography (CT) is a painless test that uses an x-ray machine to take clear, detailed pictures of your heart. For further information please visit [email or web address]. Please follow instruction sheet as given.   Your physician has requested that you have an echocardiogram. Echocardiography is a painless test that uses sound waves to create images of your heart. It provides your doctor with information about the size and shape of your heart and how well your heart's chambers and valves are working. This procedure takes approximately one hour. There are no restrictions for this procedure. Please do NOT wear cologne, perfume, aftershave, or lotions (deodorant is allowed). Please arrive 15 minutes prior to your appointment time.    Follow-Up: At [facility], you and your health needs are our priority.  As part of our continuing mission to provide you with exceptional heart care, we have created designated Provider Care Teams.  These Care Teams include your primary Cardiologist (physician) and Advanced Practice Providers (APPs -  Physician Assistants and Nurse Practitioners) who all work together to provide you with the care you need, when you need it.  We recommend signing up for the patient portal called "MyChart".  Sign up information is provided on this After Visit Summary.  MyChart is  used to connect with patients for Virtual Visits (Telemedicine).  Patients are able to view lab/test results, encounter notes, upcoming appointments, etc.  Non-urgent messages can be sent to your provider as well.   To learn more about what you can do with MyChart, go to [email or web address].    Your next appointment:   4-6 weeks   Provider:   Dr Margaretann Loveless   Other Instructions   Your cardiac CT will be scheduled at one of the below locations:   [facility] [address] [place], [place] [address] [phone]  [facility] [address] [place], [place] [address] [phone]  [facility] [place], [place] [address] [phone]  If scheduled at [facility] please arrive at the [facility] and Children's Entrance (Entrance C2) of [facility] 30 minutes prior to test start time. You can use the FREE valet parking offered at entrance C (encouraged to control the heart rate for the test)  Proceed to the [facility] Radiology Department (first floor) to check-in and test prep.  All radiology patients and guests should use entrance C2 at [facility], accessed from [facility], even though the hospital's physical address listed is [address].    If scheduled at [facility] or [facility], please arrive 15 mins early for check-in and test prep.   Please follow these instructions carefully (unless otherwise directed):  Hold all erectile dysfunction medications at least 3 days (72 hrs) prior to test. (Ie viagra,  cialis, sildenafil, tadalafil, etc) We will administer nitroglycerin during this exam.   On the Night Before the Test: Be sure to Drink plenty of water. Do not consume any caffeinated/decaffeinated beverages or chocolate 12 hours prior to your test. Do not take any  antihistamines 12 hours prior to your test.  On the Day of the Test: Drink plenty of water until 1 hour prior to the test. Do not eat any food 1 hour prior to test. You may take your regular medications prior to the test.  Take metoprolol (Lopressor) two hours prior to test. HOLD Furosemide/Hydrochlorothiazide morning of the test. FEMALES- please wear underwire-free bra if available, avoid dresses & tight clothing       After the Test: Drink plenty of water. After receiving IV contrast, you may experience a mild flushed feeling. This is normal. On occasion, you may experience a mild rash up to 24 hours after the test. This is not dangerous. If this occurs, you can take Benadryl 25 mg and increase your fluid intake. If you experience trouble breathing, this can be serious. If it is severe call 911 IMMEDIATELY. If it is mild, please call our office. If you take any of these medications: Glipizide/Metformin, Avandament, Glucavance, please do not take 48 hours after completing test unless otherwise instructed.  We will call to schedule your test 2-4 weeks out understanding that some insurance companies will need an authorization prior to the service being performed.   For non-scheduling related questions, please contact the cardiac imaging nurse navigator should you have any questions/concerns: Marchia Bond, Cardiac Imaging Nurse Navigator Gordy Clement, Cardiac Imaging Nurse Navigator [facility] Direct Office Dial: [phone]   For scheduling needs, including cancellations and rescheduling, please call [place], [phone].

## 2022-05-19 NOTE — Progress Notes (Signed)
Cardiology Office Note:    Date:  05/19/2022   ID:  Marissa Martin, DOB [birth year], MRN [id number]  PCP:  Merri Brunette, MD  Cardiologist:  None  Electrophysiologist:  None   Referring MD: Merri Brunette, MD   Chief Complaint/Reason for Referral: Elevated coronary Ca score  History of Present Illness:    Marissa Martin is a 62 y.o. female with a history of CAD, hyperlipidemia, and obesity, here for the evaluation of elevated coronary calcium score.  Referral notes from Lauretta Chester, FNP personally reviewed. At their visit 03/16/2022 they discussed his CT scan 02/2022 revealing a coronary calcium score of 1011 (LAD - 893, LCx - 51, RCA - 67). She declined statin therapy. She was referred to cardiology for further evaluation and management.  Today, we reviewed the results of her coronary calcium score in detail. Reviewed images together in the room. She states that given her age and mother's history she was recommended for the calcium score. Currently she is trying to manage cholesterol with lifestyle. Reportedly her cholesterol was elevated at one point, but decreased subsequently. We reviewed her most recent lipid panel 03/22/2022 showing LDL 128. Regarding statin therapy, she would like to avoid medications if at all possible, but is also very concerned about side effects which we discussed at length.  Her blood pressure is elevated in clinic at 148/98 which she attributes to rushing to the appointment and eating recently.  Last November she completed an 8K by jogging in 1 hour 10 minutes. No anginal symptoms. In April she participates in a 10K. She has been completing these events with friends for years.  Usually she does not take anything for general pain management. She develops side effects including GI upset with tylenol or aspirin. Recently she returned to taking daily vitamins. We discussed caution with supplement use.  Additionally she reports developing thyroid issues a couple years  ago, now resolved per her report. She also had more recent blood work performed this past Monday.  In her family, her mother had multiple health issues. The patient has a general dislike for medications since she felt that may have contributed to her mother's health issues.  The patient denies chest pain, chest pressure, dyspnea at rest or with exertion, palpitations, PND, orthopnea, or leg swelling. Denies cough, fever, chills. Denies nausea, vomiting. Denies syncope or presyncope. Denies dizziness or lightheadedness.   Past Medical History:  Diagnosis Date   Iron deficiency anemia     No past surgical history on file.  Current Medications: Current Meds  Medication Sig   ACAI PO Take by mouth as needed.   Calcium Carbonate-Vitamin D (CALCIUM 600 + D PO) Take by mouth. 2 in am, 1 in pm   Cholecalciferol (VITAMIN D3) 25 MCG (1000 UT) CAPS 2 capsule Orally Once a day   iron polysaccharides (NIFEREX) 150 MG capsule Take 1 capsule (150 mg total) by mouth 2 (two) times daily.   Maca Root (MACA PO) Take by mouth as needed.   metoprolol tartrate (LOPRESSOR) 100 MG tablet Take 1 tablet (100 mg total) by mouth once for 1 dose. PLEASE TAKE METOPROLOL 2  HOURS PRIOR TO CTA SCAN.   Multiple Vitamin (MULTIVITAMIN) tablet Take 1 tablet by mouth daily.   NON FORMULARY Take 1 tablet by mouth daily. Nutriview (eye health complex) has various minerals, etc.   NON FORMULARY Take 3 tablets by mouth daily. Replenex - "joint complex"- has "various minerals, etc"   NON FORMULARY Take 2 tablets by  mouth 2 (two) times daily. Provex-CV "cardiovascular supplement"   NON FORMULARY Take 2 tablets by mouth daily. Phytomega - "heart and cholesterol" supplement   NON FORMULARY Take by mouth as needed. Estraval- hormone complex   NON FORMULARY Take 1 tablet by mouth as needed. Activate-C   NON FORMULARY Take by mouth every evening. Fiberwise   NON FORMULARY Take by mouth as needed. Hemp protein powder with fiber   NON  FORMULARY Take by mouth as needed. Cacao nibs   NON FORMULARY Take by mouth as needed. Coconut powder   NON FORMULARY Take by mouth as needed. Powdered wheat grass   NON FORMULARY Take 30 mLs by mouth daily. Liquid Chloryphyll   NON FORMULARY Take by mouth daily. "Supergreens"   Probiotic Product (PROBIOTIC DAILY PO) Take 1-2 tablets by mouth daily.     Allergies:   Patient has no known allergies.       Family History: The patient's family history includes Heart failure in her mother and sister. As noted in HPI.  ROS:   Please see the history of present illness.    All other systems reviewed and are negative.  EKGs/Labs/Other Studies Reviewed:    The following studies were reviewed today:  CT Calcium Scoring  03/01/2022: FINDINGS: Coronary arteries: Normal origins.   Coronary Calcium Score:   Left main: 0   Left anterior descending artery: 893   Left circumflex artery: 51   Right coronary artery: 67   Total: 1011   Percentile: 99th   Pericardium: Normal.   Aorta: Normal caliber of ascending aorta. No aortic atherosclerosis noted.   Non-cardiac: See separate report from [facility].   IMPRESSION: Coronary calcium score of 1011. This was 99th percentile for age-, race-, and sex-matched controls.  EKG:  EKG is personally reviewed. 05/19/2022: Sinus rhythm. Rate 76 bpm.  Imaging studies that I have independently reviewed today: Ct cor cal, reviewed with patient in the room.  Recent Labs: No results found for requested labs within last 365 days.   Recent Lipid Panel No results found for: "CHOL", "TRIG", "HDL", "CHOLHDL", "VLDL", "LDLCALC", "LDLDIRECT"  Physical Exam:    VS:  BP (!) 150/92   Pulse 76   Ht 5\' 3"  (1.6 m)   Wt 243 lb 6.4 oz (110.4 kg)   SpO2 93%   BMI 43.12 kg/m     Wt Readings from Last 5 Encounters:  05/19/22 243 lb 6.4 oz (110.4 kg)  06/26/12 209 lb 1.6 oz (94.8 kg)  02/14/12 212 lb 12.8 oz (96.5 kg)    Constitutional:  No acute distress Eyes: sclera non-icteric, normal conjunctiva and lids ENMT: normal dentition, moist mucous membranes Cardiovascular: regular rhythm, normal rate, 2/6 systolic murmur at the upper sternal border. S1 and S2 normal. No jugular venous distention.  Respiratory: clear to auscultation bilaterally GI : normal bowel sounds, soft and nontender. No distention.   MSK: extremities warm, well perfused. No edema.  NEURO: grossly nonfocal exam, moves all extremities. PSYCH: alert and oriented x 3, normal mood and affect.   ASSESSMENT:    1. Elevated coronary artery calcium score   2. Systolic murmur   3. Hyperlipidemia, unspecified hyperlipidemia type    PLAN:    Elevated coronary artery calcium score - Plan: EKG 12-Lead, CT CORONARY MORPH W/CTA COR W/SCORE W/CA W/CM &/OR WO/CM  Systolic murmur - Plan: EKG 12-Lead, ECHOCARDIOGRAM COMPLETE  Hyperlipidemia, unspecified hyperlipidemia type  CAD Hyperlipidemia -Coronary calcium score 1011. This was 99th percentile. Per cor cal CT  02/2022. -We discussed statin therapy at length. She would prefer to not start a statin in favor of lifestyle management. -Order coronary CTA given high risk calcium score. I have encouraged her to consider lipid lowering therapy given that CAC cannot adequately be managed by diet and lifestyle alone. She will consider.   Murmur heard on physical exam -Order echocardiogram to define. May be flow murmur in setting of hx of iron deficiency anemia. No significant calcification noted on AV on CT.  Follow-up in 4-6 weeks.   Total time of encounter: 60 minutes total time of encounter, including 40 minutes spent in face-to-face patient care on the date of this encounter. This time includes coordination of care and counseling regarding above mentioned problem list. Remainder of non-face-to-face time involved reviewing chart documents/testing relevant to the patient encounter and documentation in the medical  record. I have independently reviewed documentation from referring provider.   Cherlynn Kaiser, MD, [place]   Shared Decision Making/Informed Consent:       Medication Adjustments/Labs and Tests Ordered: Current medicines are reviewed at length with the patient today.  Concerns regarding medicines are outlined above.   Orders Placed This Encounter  Procedures   CT CORONARY MORPH W/CTA COR W/SCORE W/CA W/CM &/OR WO/CM   EKG 12-Lead   ECHOCARDIOGRAM COMPLETE   Meds ordered this encounter  Medications   metoprolol tartrate (LOPRESSOR) 100 MG tablet    Sig: Take 1 tablet (100 mg total) by mouth once for 1 dose. PLEASE TAKE METOPROLOL 2  HOURS PRIOR TO CTA SCAN.    Dispense:  1 tablet    Refill:  0   Patient Instructions  Medication Instructions:  Please take 100mg  of Metoprolol Tartrate 2hrs before CTA scan *If you need a refill on your cardiac medications before your next appointment, please call your pharmacy*   Lab Work: None If you have labs (blood work) drawn today and your tests are completely normal, you will receive your results only by: [place] (if you have MyChart) OR A paper copy in the mail If you have any lab test that is abnormal or we need to change your treatment, we will call you to review the results.   Testing/Procedures: Your physician has requested that you have cardiac CT. Cardiac computed tomography (CT) is a painless test that uses an x-ray machine to take clear, detailed pictures of your heart. For further information please visit [email or web address]. Please follow instruction sheet as given.   Your physician has requested that you have an echocardiogram. Echocardiography is a painless test that uses sound waves to create images of your heart. It provides your doctor with information about the size and shape of your heart and how well your heart's chambers and valves are working. This procedure takes approximately one  hour. There are no restrictions for this procedure. Please do NOT wear cologne, perfume, aftershave, or lotions (deodorant is allowed). Please arrive 15 minutes prior to your appointment time.    Follow-Up: At [facility], you and your health needs are our priority.  As part of our continuing mission to provide you with exceptional heart care, we have created designated Provider Care Teams.  These Care Teams include your primary Cardiologist (physician) and Advanced Practice Providers (APPs -  Physician Assistants and Nurse Practitioners) who all work together to provide you with the care you need, when you need it.  We recommend signing up for the patient portal called "MyChart".  Sign up information  is provided on this After Visit Summary.  MyChart is used to connect with patients for Virtual Visits (Telemedicine).  Patients are able to view lab/test results, encounter notes, upcoming appointments, etc.  Non-urgent messages can be sent to your provider as well.   To learn more about what you can do with MyChart, go to [email or web address].    Your next appointment:   4-6 weeks   Provider:   Dr Margaretann Loveless   Other Instructions   Your cardiac CT will be scheduled at one of the below locations:   [facility] [address] [place], [place] [address] [phone]  [place] [address] [place], [place] [address] [phone]  [facility] [place], [place] [address] [phone]  If scheduled at [facility], please arrive at the [facility] and Children's Entrance (Entrance C2) of [facility] 30 minutes prior to test start time. You can use the FREE valet parking offered at entrance C (encouraged to control the heart rate for the test)  Proceed to the [facility] Radiology Department (first floor) to check-in and test prep.  All radiology  patients and guests should use entrance C2 at [facility], accessed from [facility], even though the hospital's physical address listed is [address].    If scheduled at [facility] or [facility], please arrive 15 mins early for check-in and test prep.   Please follow these instructions carefully (unless otherwise directed):  Hold all erectile dysfunction medications at least 3 days (72 hrs) prior to test. (Ie viagra, cialis, sildenafil, tadalafil, etc) We will administer nitroglycerin during this exam.   On the Night Before the Test: Be sure to Drink plenty of water. Do not consume any caffeinated/decaffeinated beverages or chocolate 12 hours prior to your test. Do not take any antihistamines 12 hours prior to your test.  On the Day of the Test: Drink plenty of water until 1 hour prior to the test. Do not eat any food 1 hour prior to test. You may take your regular medications prior to the test.  Take metoprolol (Lopressor) two hours prior to test. HOLD Furosemide/Hydrochlorothiazide morning of the test. FEMALES- please wear underwire-free bra if available, avoid dresses & tight clothing       After the Test: Drink plenty of water. After receiving IV contrast, you may experience a mild flushed feeling. This is normal. On occasion, you may experience a mild rash up to 24 hours after the test. This is not dangerous. If this occurs, you can take Benadryl 25 mg and increase your fluid intake. If you experience trouble breathing, this can be serious. If it is severe call 911 IMMEDIATELY. If it is mild, please call our office. If you take any of these medications: Glipizide/Metformin, Avandament, Glucavance, please do not take 48 hours after completing test unless otherwise instructed.  We will call to schedule your test 2-4 weeks out understanding that some insurance companies will need an authorization prior  to the service being performed.   For non-scheduling related questions, please contact the cardiac imaging nurse navigator should you have any questions/concerns: Marchia Bond, Cardiac Imaging Nurse Navigator Gordy Clement, Cardiac Imaging Nurse Navigator [facility] Direct Office Dial: [phone]   For scheduling needs, including cancellations and rescheduling, please call [place], [phone].      I,Mathew Stumpf,acting as a  scribe for Parke Poisson, MD.,have documented all relevant documentation on the behalf of Parke Poisson, MD,as directed by  Parke Poisson, MD while in the presence of Parke Poisson, MD.  I, Parke Poisson, MD, have reviewed all documentation for the visit on 05/19/2022. The documentation on today's date of service for the exam, diagnosis, procedures, and orders are all accurate and complete.

## 2022-05-23 ENCOUNTER — Encounter: Payer: Self-pay | Admitting: Internal Medicine

## 2022-05-24 DIAGNOSIS — E559 Vitamin D deficiency, unspecified: Secondary | ICD-10-CM | POA: Diagnosis not present

## 2022-05-24 DIAGNOSIS — I2510 Atherosclerotic heart disease of native coronary artery without angina pectoris: Secondary | ICD-10-CM | POA: Diagnosis not present

## 2022-05-24 DIAGNOSIS — E7800 Pure hypercholesterolemia, unspecified: Secondary | ICD-10-CM | POA: Diagnosis not present

## 2022-05-24 DIAGNOSIS — E1159 Type 2 diabetes mellitus with other circulatory complications: Secondary | ICD-10-CM | POA: Diagnosis not present

## 2022-06-21 ENCOUNTER — Ambulatory Visit (HOSPITAL_COMMUNITY): Payer: BC Managed Care – PPO | Attending: Internal Medicine

## 2022-06-21 DIAGNOSIS — R011 Cardiac murmur, unspecified: Secondary | ICD-10-CM | POA: Diagnosis not present

## 2022-06-22 ENCOUNTER — Telehealth (HOSPITAL_COMMUNITY): Payer: Self-pay | Admitting: Emergency Medicine

## 2022-06-22 NOTE — Telephone Encounter (Signed)
Attempted to call patient regarding upcoming cardiac CT appointment. °Left message on voicemail with name and callback number °Daeja Helderman RN Navigator Cardiac Imaging °[facility] °[phone] Office °[phone] Cell ° °

## 2022-06-24 ENCOUNTER — Ambulatory Visit (HOSPITAL_COMMUNITY)
Admission: RE | Admit: 2022-06-24 | Discharge: 2022-06-24 | Disposition: A | Discharge status: Home / Self Care | Payer: BC Managed Care – PPO | Source: Ambulatory Visit | Attending: Internal Medicine | Admitting: Internal Medicine

## 2022-06-24 ENCOUNTER — Other Ambulatory Visit: Payer: Self-pay | Admitting: Cardiovascular Disease

## 2022-06-24 DIAGNOSIS — R931 Abnormal findings on diagnostic imaging of heart and coronary circulation: Secondary | ICD-10-CM | POA: Diagnosis not present

## 2022-06-24 DIAGNOSIS — I2510 Atherosclerotic heart disease of native coronary artery without angina pectoris: Secondary | ICD-10-CM | POA: Diagnosis not present

## 2022-06-24 MED ORDER — METOPROLOL TARTRATE 5 MG/5ML IV SOLN
INTRAVENOUS | Status: AC
  Administered 2022-06-24: 10 mg via INTRAVENOUS
  Filled 2022-06-24: qty 10

## 2022-06-24 MED ORDER — METOPROLOL TARTRATE 5 MG/5ML IV SOLN: 10.0000 mg | Freq: Once | INTRAVENOUS | Status: AC

## 2022-06-24 MED ORDER — NITROGLYCERIN 0.4 MG SL SUBL: 0.8000 mg | SUBLINGUAL_TABLET | Freq: Once | SUBLINGUAL | Status: AC

## 2022-06-24 MED ORDER — NITROGLYCERIN 0.4 MG SL SUBL
SUBLINGUAL_TABLET | SUBLINGUAL | Status: AC
  Administered 2022-06-24: 0.8 mg via SUBLINGUAL
  Filled 2022-06-24: qty 2

## 2022-06-24 MED ORDER — IOHEXOL 350 MG/ML SOLN
95.0000 mL | Freq: Once | INTRAVENOUS | Status: AC | PRN
  Administered 2022-06-24: 95 mL via INTRAVENOUS

## 2022-06-25 ENCOUNTER — Ambulatory Visit (HOSPITAL_COMMUNITY)
Admission: RE | Admit: 2022-06-25 | Discharge: 2022-06-25 | Disposition: A | Discharge status: Home / Self Care | Payer: BC Managed Care – PPO | Source: Ambulatory Visit | Attending: Cardiovascular Disease | Admitting: Cardiovascular Disease

## 2022-06-25 DIAGNOSIS — I2510 Atherosclerotic heart disease of native coronary artery without angina pectoris: Secondary | ICD-10-CM | POA: Diagnosis not present

## 2022-06-25 DIAGNOSIS — R931 Abnormal findings on diagnostic imaging of heart and coronary circulation: Secondary | ICD-10-CM | POA: Diagnosis not present

## 2022-06-29 ENCOUNTER — Other Ambulatory Visit: Payer: Self-pay | Admitting: *Deleted

## 2022-06-29 ENCOUNTER — Ambulatory Visit: Payer: BC Managed Care – PPO | Admitting: Internal Medicine

## 2022-06-29 DIAGNOSIS — I059 Rheumatic mitral valve disease, unspecified: Secondary | ICD-10-CM

## 2022-07-01 LAB — ECHOCARDIOGRAM COMPLETE
Area-P 1/2: 4.36 cm2
MV M vel: 5.78 m/s
MV Peak grad: 133.6 mmHg
Radius: 0.6 cm
S' Lateral: 2.8 cm

## 2022-08-30 NOTE — Progress Notes (Signed)
Cardiology Office Note:    Date:  08/31/2022   ID:  Marissa Martin, DOB 09-15-1960, MRN [id number]  PCP:  Merri Brunette, MD  Cardiologist:  Parke Poisson, MD  Electrophysiologist:  None   Referring MD: Merri Brunette, MD   Chief Complaint/Reason for Referral: Elevated coronary Ca score  History of Present Illness:    Marissa Martin is a 62 y.o. female with a history of CAD, hyperlipidemia, and obesity, here for the evaluation of elevated coronary calcium score.  Referral notes from Lauretta Chester, FNP personally reviewed. At a prior visit on 03/16/2022 they discussed his CT scan 02/2022 revealing a coronary calcium score of 1011 (LAD - 893, LCx - 51, RCA - 67). She declined statin therapy. She was referred to cardiology for further evaluation and management.  At her last appointment, we reviewed the results of her coronary calcium score in detail. Reviewed images together in the room. She stated that given her age and mother's history she was recommended for the calcium score. At the time, She was trying to manage cholesterol with lifestyle. Reportedly her cholesterol was elevated at one point, but decreased subsequently. We reviewed her lipid panel from 03/22/2022 showing LDL 128. Regarding statin therapy, she wanted to avoid medications if at all possible, but was also very concerned about side effects which we discussed at length.  Her blood pressure was elevated in clinic at 148/98 which she attributes to rushing to the appointment and eating recently.  Last November she completed an 8K by jogging in 1 hour 10 minutes. No anginal symptoms at the time. She had planned to participate in a 10K in April. She had been completing these events with friends for years.  Usually she does not take anything for general pain management. She would develop side effects including GI upset with tylenol or aspirin. At the time, she returned to taking daily vitamins. We discussed caution with supplement  use.  Additionally, she reported developing thyroid issues a couple years ago, now resolved per her report.   In her family, her mother had multiple health issues. The patient has a general dislike for medications since she felt that may have contributed to her mother's health issues.  Today, she is overall feeling well. She recently walked a 10k in [place]. She states she did pretty well and had no limitations.   We discussed the results of her recent testing. Her echo was unrevealing and her CT showed moderate CAD with no obstruction by FFR.  Indeterminate possibly infectious finding on chest CT, she plans to schedule repeat CT in late May or June.  She was ill at the time of the scan, likely infection.  We also discussed the option of starting statins. She is a bit hesitant but, is receptive to learning more about this option. She has been taking cholesterol supplements.  Will arrange for CVR.  She monitors her blood pressure at home but admits she has not been checking it every day. She states it has lately been around 134 systolic or higher.  Needs to keep a BP journal, likely needs therapy.  She reports a bit of LE edema in her ankles bilaterally. She states they were swollen before and after the 10k but were better after.   She denies any palpitations, chest pain, shortness of breath. No lightheadedness, headaches, syncope, orthopnea, or PND.   Past Medical History:  Diagnosis Date   Iron deficiency anemia     No past surgical history on file.  Current Medications: Current Meds  Medication Sig   Cholecalciferol (VITAMIN D3) 25 MCG (1000 UT) CAPS 2 capsule Orally Once a day   iron polysaccharides (NIFEREX) 150 MG capsule Take 1 capsule (150 mg total) by mouth 2 (two) times daily.   Maca Root (MACA PO) Take by mouth as needed.   Multiple Vitamin (MULTIVITAMIN) tablet Take 1 tablet by mouth daily.   NON FORMULARY Take 1 tablet by mouth daily. Nutriview (eye health complex) has  various minerals, etc.   NON FORMULARY Take 3 tablets by mouth daily. Replenex - "joint complex"- has "various minerals, etc"   NON FORMULARY Take 2 tablets by mouth 2 (two) times daily. Provex-CV "cardiovascular supplement"   NON FORMULARY Take 2 tablets by mouth daily. Phytomega - "heart and cholesterol" supplement   NON FORMULARY Take by mouth as needed. Estraval- hormone complex   NON FORMULARY Take 1 tablet by mouth as needed. Activate-C   NON FORMULARY Take by mouth every evening. Fiberwise   NON FORMULARY Take by mouth as needed. Hemp protein powder with fiber   NON FORMULARY Take by mouth as needed. Cacao nibs   NON FORMULARY Take by mouth as needed. Coconut powder   NON FORMULARY Take 30 mLs by mouth daily. Liquid Chloryphyll   NON FORMULARY Take by mouth daily. "Supergreens"   Probiotic Product (PROBIOTIC DAILY PO) Take 1-2 tablets by mouth daily.     Allergies:   Patient has no known allergies.       Family History: The patient's family history includes Heart failure in her mother and sister. As noted in HPI.  ROS:   Please see the history of present illness.    (+) LE edema (ankles) All other systems reviewed and are negative.  EKGs/Labs/Other Studies Reviewed:    The following studies were reviewed today:   Chest CT 06/24/2022: IMPRESSION: Clustered nodular opacities in the posterior left upper lobe and ground-glass opacities in the posterior lingula, favored to be infectious/inflammatory. Recommend follow-up chest CT in 3 months to assess for interval change/ensure resolution.  Echo 06/21/2022: IMPRESSIONS   1. Left ventricular ejection fraction, by estimation, is 60 to 65%. The  left ventricle has normal function. The left ventricle has no regional  wall motion abnormalities. Left ventricular diastolic parameters were  normal.   2. Right ventricular systolic function is normal. The right ventricular  size is normal. There is normal pulmonary artery systolic  pressure.   3. Left atrial size was mildly dilated.   4. The mitral valve is normal in structure. Mild mitral valve  regurgitation. No evidence of mitral stenosis.   5. The aortic valve is tricuspid. Aortic valve regurgitation is mild. No  aortic stenosis is present.   6. The inferior vena cava is normal in size with greater than 50%  respiratory variability, suggesting right atrial pressure of 3 mmHg.    CT Calcium Scoring  03/01/2022: FINDINGS: Coronary arteries: Normal origins.   Coronary Calcium Score:   Left main: 0   Left anterior descending artery: 893   Left circumflex artery: 51   Right coronary artery: 67   Total: 1011   Percentile: 99th   Pericardium: Normal.   Aorta: Normal caliber of ascending aorta. No aortic atherosclerosis noted.   Non-cardiac: See separate report from [facility].   IMPRESSION: Coronary calcium score of 1011. This was 99th percentile for age-, race-, and sex-matched controls.  EKG:  EKG is personally reviewed. 08/31/2022: EKG was not ordered. 05/19/2022: Sinus rhythm. Rate 76  bpm.  Imaging studies that I have independently reviewed today: Ct cor cal, reviewed with patient in the room.  Recent Labs: No results found for requested labs within last 365 days.   Recent Lipid Panel No results found for: "CHOL", "TRIG", "HDL", "CHOLHDL", "VLDL", "LDLCALC", "LDLDIRECT"  Physical Exam:    VS:  BP (!) 160/98 (BP Location: Left Arm, Patient Position: Sitting, Cuff Size: Large)   Pulse 78   Ht 5\' 3"  (1.6 m)   Wt 246 lb 9.6 oz (111.9 kg)   SpO2 100%   BMI 43.68 kg/m     Wt Readings from Last 5 Encounters:  08/31/22 246 lb 9.6 oz (111.9 kg)  05/19/22 243 lb 6.4 oz (110.4 kg)  06/26/12 209 lb 1.6 oz (94.8 kg)  02/14/12 212 lb 12.8 oz (96.5 kg)    Constitutional: No acute distress Eyes: sclera non-icteric, normal conjunctiva and lids ENMT: normal dentition, moist mucous membranes Cardiovascular: regular rhythm, normal  rate, faint systolic murmur at the upper sternal border. S1 and S2 normal. No jugular venous distention.  Respiratory: clear to auscultation bilaterally GI : normal bowel sounds, soft and nontender. No distention.   MSK: extremities warm, well perfused.  Trace bilateral edema.  NEURO: grossly nonfocal exam, moves all extremities. PSYCH: alert and oriented x 3, normal mood and affect.   ASSESSMENT:    1. Lung nodule   2. Hyperlipidemia, unspecified hyperlipidemia type   3. Mitral valve disorder   4. CAD in native artery   5. Elevated coronary artery calcium score   6. Systolic murmur     PLAN:    CAD Hyperlipidemia -Coronary calcium score 1011. This was 99th percentile. Per cor cal CT 02/2022.  Coronary CTA with moderate disease and no flow limitation by FFR. -We discussed statin therapy at length. She would prefer to not start a statin in favor of lifestyle management.  Will refer to CV[facility] for consideration of PCSK9 inhibitor.  Murmur heard on physical exam -Echo shows mild to moderate MR and mild AI, no other significant findings.  Observe.  Echo in 1 year to follow MR.  Lung nodule unspecified -Repeat CT planned for May/June per radiologist recommendations.  Likely infectious in origin given patient's temporally correlating symptoms.    Total time of encounter: 30 minutes total time of encounter, including 20 minutes spent in face-to-face patient care on the date of this encounter. This time includes coordination of care and counseling regarding above mentioned problem list. Remainder of non-face-to-face time involved reviewing chart documents/testing relevant to the patient encounter and documentation in the medical record. I have independently reviewed documentation from referring provider.   Weston Brass, MD, [facility]  [facility]   Shared Decision Making/Informed Consent:       Medication Adjustments/Labs and Tests Ordered: Current medicines are reviewed  at length with the patient today.  Concerns regarding medicines are outlined above.   Orders Placed This Encounter  Procedures   CT Chest Wo Contrast   AMB Referral to [facility]   No orders of the defined types were placed in this encounter.  Patient Instructions  Medication Instructions:  No Changes In Medications at this time.  *If you need a refill on your cardiac medications before your next appointment, please call your pharmacy*  Lab Work: None Ordered At This Time.  If you have labs (blood work) drawn today and your tests are completely normal, you will receive your results only by: MyChart Message (if you have MyChart) OR  A paper copy in the mail If you have any lab test that is abnormal or we need to change your treatment, we will call you to review the results.  Testing/Procedures: CHEST CT WO CONTRAST IN 1 MONTH  Follow-Up: At [facility], you and your health needs are our priority.  As part of our continuing mission to provide you with exceptional heart care, we have created designated Provider Care Teams.  These Care Teams include your primary Cardiologist (physician) and Advanced Practice Providers (APPs -  Physician Assistants and Nurse Practitioners) who all work together to provide you with the care you need, when you need it.  PLEASE SCHEDULE NEXT AVAILABLE APPOINTMENT WITH [facility] ([facility]MD) FOR LIPID CLINIC  Your next appointment:   1 year(s)  Provider:   Parke Poisson, MD         I,Rachel Rivera,acting as a scribe for Parke Poisson, MD.,have documented all relevant documentation on the behalf of Parke Poisson, MD,as directed by  Parke Poisson, MD while in the presence of Parke Poisson, MD.  I, Parke Poisson, MD, have reviewed all documentation for the visit on 08/31/2022. The documentation on today's date of service for the exam, diagnosis, procedures, and orders are all accurate and complete.

## 2022-08-31 ENCOUNTER — Ambulatory Visit: Payer: BC Managed Care – PPO | Attending: Internal Medicine | Admitting: Internal Medicine

## 2022-08-31 VITALS — BP 160/98 | HR 78 | Ht 63.0 in | Wt 246.6 lb

## 2022-08-31 DIAGNOSIS — I059 Rheumatic mitral valve disease, unspecified: Secondary | ICD-10-CM | POA: Diagnosis not present

## 2022-08-31 DIAGNOSIS — I2510 Atherosclerotic heart disease of native coronary artery without angina pectoris: Secondary | ICD-10-CM | POA: Diagnosis not present

## 2022-08-31 DIAGNOSIS — E785 Hyperlipidemia, unspecified: Secondary | ICD-10-CM

## 2022-08-31 DIAGNOSIS — R911 Solitary pulmonary nodule: Secondary | ICD-10-CM | POA: Diagnosis not present

## 2022-08-31 DIAGNOSIS — R011 Cardiac murmur, unspecified: Secondary | ICD-10-CM

## 2022-08-31 DIAGNOSIS — R931 Abnormal findings on diagnostic imaging of heart and coronary circulation: Secondary | ICD-10-CM

## 2022-08-31 NOTE — Patient Instructions (Signed)
Medication Instructions:  No Changes In Medications at this time.  *If you need a refill on your cardiac medications before your next appointment, please call your pharmacy*  Lab Work: None Ordered At This Time.  If you have labs (blood work) drawn today and your tests are completely normal, you will receive your results only by: MyChart Message (if you have MyChart) OR A paper copy in the mail If you have any lab test that is abnormal or we need to change your treatment, we will call you to review the results.  Testing/Procedures: CHEST CT WO CONTRAST IN 1 MONTH  Follow-Up: At [facility], you and your health needs are our priority.  As part of our continuing mission to provide you with exceptional heart care, we have created designated Provider Care Teams.  These Care Teams include your primary Cardiologist (physician) and Advanced Practice Providers (APPs -  Physician Assistants and Nurse Practitioners) who all work together to provide you with the care you need, when you need it.  PLEASE SCHEDULE NEXT AVAILABLE APPOINTMENT WITH [facility] ([facility]) FOR LIPID CLINIC  Your next appointment:   1 year(s)  Provider:   Parke Poisson, MD

## 2022-09-02 ENCOUNTER — Encounter: Payer: Self-pay | Admitting: Pharmacist

## 2022-09-02 ENCOUNTER — Ambulatory Visit: Payer: BC Managed Care – PPO | Attending: Internal Medicine | Admitting: Pharmacist

## 2022-09-02 DIAGNOSIS — E785 Hyperlipidemia, unspecified: Secondary | ICD-10-CM | POA: Insufficient documentation

## 2022-09-02 DIAGNOSIS — R931 Abnormal findings on diagnostic imaging of heart and coronary circulation: Secondary | ICD-10-CM

## 2022-09-02 DIAGNOSIS — I2510 Atherosclerotic heart disease of native coronary artery without angina pectoris: Secondary | ICD-10-CM

## 2022-09-02 NOTE — Patient Instructions (Addendum)
It was nice meeting you today  We would like your LDL (bad cholesterol) to be less than 70 and your triglycerides to be less than 150  The medication we are recommending is called Repatha. If you decide to start you would inject once every 2 weeks  Please message or call with any questions  [name], PharmD, BCACP, CDCES, CPP [address] [place], [place], [address] Phone: [phone], Fax: [phone]

## 2022-09-02 NOTE — Progress Notes (Unsigned)
Patient ID: Marissa Martin                 DOB: [birth year]                    MRN: [id number]     HPI: Marissa Martin is a 62 y.o. female patient referred to lipid clinic by Dr Jacques Navy. PMH is significant for CAD, elevated coronary calcium score, HLD, and HTN. Patient has been resistant to starting any medications for her CAD.  Patient presents today to discuss HLD/CAD. Has not wanted to start any medications for cholesterol despite elevated coronary calcium score and family history of CAD.  Does however take about 10 supplements from [employer] which she pays a membership for.  Has been trying to increase her exercise. Walked a 10K last week. Non smoker. Prediabetic.  Current Medications: N/A  Intolerances: N/A  Risk Factors:  CAD Family history Elevated coronary calcium score  LDL goal: <70  Labs: TC 226, Trigs 58, HDL 86, LDL 128  (01/08/22)  Imaging:  Coronary calcium score of 1011. This was 99th percentile for age-,race-, and sex-matched control  Past Medical History:  Diagnosis Date   Iron deficiency anemia     Current Outpatient Medications on File Prior to Visit  Medication Sig Dispense Refill   Cholecalciferol (VITAMIN D3) 25 MCG (1000 UT) CAPS 2 capsule Orally Once a day     iron polysaccharides (NIFEREX) 150 MG capsule Take 1 capsule (150 mg total) by mouth 2 (two) times daily. 60 capsule prn   loratadine (CLARITIN) 10 MG tablet Take 10 mg by mouth daily.     Maca Root (MACA PO) Take by mouth as needed.     metoprolol tartrate (LOPRESSOR) 100 MG tablet Take 1 tablet (100 mg total) by mouth once for 1 dose. PLEASE TAKE METOPROLOL 2  HOURS PRIOR TO CTA SCAN. 1 tablet 0   Multiple Vitamin (MULTIVITAMIN) tablet Take 1 tablet by mouth daily.     NON FORMULARY Take 1 tablet by mouth daily. Nutriview (eye health complex) has various minerals, etc.     NON FORMULARY Take 3 tablets by mouth daily. Replenex - "joint complex"- has "various minerals, etc"     NON  FORMULARY Take 2 tablets by mouth 2 (two) times daily. Provex-CV "cardiovascular supplement"     NON FORMULARY Take 2 tablets by mouth daily. Phytomega - "heart and cholesterol" supplement     NON FORMULARY Take by mouth as needed. Estraval- hormone complex     NON FORMULARY Take 1 tablet by mouth as needed. Activate-C     NON FORMULARY Take by mouth every evening. Fiberwise     NON FORMULARY Take by mouth as needed. Hemp protein powder with fiber     NON FORMULARY Take by mouth as needed. Cacao nibs     NON FORMULARY Take by mouth as needed. Coconut powder     NON FORMULARY Take by mouth as needed. Powdered wheat grass (Patient not taking: Reported on 08/31/2022)     NON FORMULARY Take 30 mLs by mouth daily. Liquid Chloryphyll     NON FORMULARY Take by mouth daily. "[employer]greens"     Probiotic Product (PROBIOTIC DAILY PO) Take 1-2 tablets by mouth daily.     Probiotic Product (PROBIOTIC PO) Probiotic     No current facility-administered medications on file prior to visit.    No Known Allergies  Assessment/Plan:  1. Hyperlipidemia - Patient's last LDL 128 which is  above goal of <70. More concerning is CAC score of 1011 (99th percentile). Calculated ASCVD risk score: 6.9%  Discussed mechansim of action  of statins and their role in controlling LDL and managing CAD. Patient very hesitant regarding possible adverse events. One of her supplements contains plant sterols as well. Advised that those are helpful but unlikely to cause her to reach LDL goal.   Discussed mechanism of action of Repatha and its role in possibly helping plaque regression. Explained storage, site selection, administration, and possible adverse effects.   Patient remains hesitant and would like to control with diet, exercise, and her supplements. Will continue to research at home and message lipid clinic or Dr Jacques Navy if she would like to start.  [name], PharmD, BCACP, CDCES, CPP [address], [address] [place], [place], [address] Phone: [phone], Fax: [phone]  Reviewed options for lowering LDL cholesterol, including statins, ezetimibe, PCSK9 inhibitors, Nexletol/Nexlizet, and Leqvio. Discussed mechanisms of action, dosing, side effects and potential decreases in LDL cholesterol. Also reviewed cost information and potential options for patient assistance.

## 2022-10-13 DIAGNOSIS — I10 Essential (primary) hypertension: Secondary | ICD-10-CM | POA: Diagnosis not present

## 2022-10-13 DIAGNOSIS — E119 Type 2 diabetes mellitus without complications: Secondary | ICD-10-CM | POA: Diagnosis not present

## 2022-10-13 DIAGNOSIS — E039 Hypothyroidism, unspecified: Secondary | ICD-10-CM | POA: Diagnosis not present

## 2022-10-13 DIAGNOSIS — E559 Vitamin D deficiency, unspecified: Secondary | ICD-10-CM | POA: Diagnosis not present

## 2022-10-13 DIAGNOSIS — E7800 Pure hypercholesterolemia, unspecified: Secondary | ICD-10-CM | POA: Diagnosis not present

## 2022-10-13 LAB — LAB REPORT - SCANNED
A1c: 6
A1c: 6.2
A1c: 6.5
A1c: 6.5
EGFR: 62

## 2022-10-20 ENCOUNTER — Encounter: Payer: Self-pay | Admitting: Registered Nurse

## 2022-10-20 ENCOUNTER — Other Ambulatory Visit: Payer: Self-pay | Admitting: Registered Nurse

## 2022-10-20 DIAGNOSIS — D509 Iron deficiency anemia, unspecified: Secondary | ICD-10-CM | POA: Diagnosis not present

## 2022-10-20 DIAGNOSIS — I2510 Atherosclerotic heart disease of native coronary artery without angina pectoris: Secondary | ICD-10-CM | POA: Diagnosis not present

## 2022-10-20 DIAGNOSIS — I700 Atherosclerosis of aorta: Secondary | ICD-10-CM | POA: Diagnosis not present

## 2022-10-20 DIAGNOSIS — Z8639 Personal history of other endocrine, nutritional and metabolic disease: Secondary | ICD-10-CM

## 2022-10-20 DIAGNOSIS — Z0000 Encounter for general adult medical examination without abnormal findings: Secondary | ICD-10-CM | POA: Diagnosis not present

## 2022-10-27 ENCOUNTER — Ambulatory Visit
Admission: RE | Admit: 2022-10-27 | Discharge: 2022-10-27 | Disposition: A | Discharge status: Home / Self Care | Payer: BC Managed Care – PPO | Source: Ambulatory Visit | Attending: Registered Nurse | Admitting: Registered Nurse

## 2022-10-27 DIAGNOSIS — E041 Nontoxic single thyroid nodule: Secondary | ICD-10-CM | POA: Diagnosis not present

## 2022-10-27 DIAGNOSIS — Z8639 Personal history of other endocrine, nutritional and metabolic disease: Secondary | ICD-10-CM

## 2023-04-13 DIAGNOSIS — E1159 Type 2 diabetes mellitus with other circulatory complications: Secondary | ICD-10-CM | POA: Diagnosis not present

## 2023-04-13 DIAGNOSIS — E7800 Pure hypercholesterolemia, unspecified: Secondary | ICD-10-CM | POA: Diagnosis not present

## 2023-04-13 DIAGNOSIS — E0500 Thyrotoxicosis with diffuse goiter without thyrotoxic crisis or storm: Secondary | ICD-10-CM | POA: Diagnosis not present

## 2023-04-13 LAB — LAB REPORT - SCANNED
A1c: 7
EGFR: 62

## 2023-05-15 DIAGNOSIS — E1159 Type 2 diabetes mellitus with other circulatory complications: Secondary | ICD-10-CM | POA: Diagnosis not present

## 2023-05-15 DIAGNOSIS — I700 Atherosclerosis of aorta: Secondary | ICD-10-CM | POA: Diagnosis not present

## 2023-05-15 DIAGNOSIS — E559 Vitamin D deficiency, unspecified: Secondary | ICD-10-CM | POA: Diagnosis not present

## 2023-05-15 DIAGNOSIS — I2510 Atherosclerotic heart disease of native coronary artery without angina pectoris: Secondary | ICD-10-CM | POA: Diagnosis not present

## 2023-05-16 ENCOUNTER — Telehealth: Payer: Self-pay | Admitting: *Deleted

## 2023-05-16 NOTE — Telephone Encounter (Signed)
 LVM regarding scheduling appointment to see Dr. Loni. We need to schedule her for one of the following dates/times (after her Echo):   Lets get her in to see me after her echo. Jan 28 at 8 am, jan 30 at 1 pm or feb 6 at 10 am. We need to review these labs    -Dr. Loni

## 2023-06-02 ENCOUNTER — Ambulatory Visit (HOSPITAL_COMMUNITY)
Admission: RE | Admit: 2023-06-02 | Discharge: 2023-06-02 | Disposition: A | Discharge status: Home / Self Care | Payer: BC Managed Care – PPO | Source: Ambulatory Visit | Attending: Internal Medicine | Admitting: Internal Medicine

## 2023-06-02 DIAGNOSIS — I059 Rheumatic mitral valve disease, unspecified: Secondary | ICD-10-CM | POA: Diagnosis not present

## 2023-06-02 LAB — ECHOCARDIOGRAM COMPLETE
AR max vel: 1.95 cm2
AV Area VTI: 2.09 cm2
AV Area mean vel: 1.97 cm2
AV Mean grad: 4 mm[Hg]
AV Peak grad: 7 mm[Hg]
Ao pk vel: 1.32 m/s
Area-P 1/2: 4.21 cm2
S' Lateral: 3.49 cm

## 2023-06-06 ENCOUNTER — Telehealth: Payer: Self-pay | Admitting: *Deleted

## 2023-06-06 ENCOUNTER — Encounter: Payer: Self-pay | Admitting: Internal Medicine

## 2023-06-06 ENCOUNTER — Ambulatory Visit: Payer: BC Managed Care – PPO | Attending: Internal Medicine | Admitting: Internal Medicine

## 2023-06-06 VITALS — BP 142/72 | HR 75 | Ht 63.0 in | Wt 262.6 lb

## 2023-06-06 DIAGNOSIS — R911 Solitary pulmonary nodule: Secondary | ICD-10-CM

## 2023-06-06 DIAGNOSIS — I059 Rheumatic mitral valve disease, unspecified: Secondary | ICD-10-CM

## 2023-06-06 DIAGNOSIS — I2510 Atherosclerotic heart disease of native coronary artery without angina pectoris: Secondary | ICD-10-CM

## 2023-06-06 DIAGNOSIS — R931 Abnormal findings on diagnostic imaging of heart and coronary circulation: Secondary | ICD-10-CM | POA: Diagnosis not present

## 2023-06-06 DIAGNOSIS — R011 Cardiac murmur, unspecified: Secondary | ICD-10-CM

## 2023-06-06 DIAGNOSIS — E785 Hyperlipidemia, unspecified: Secondary | ICD-10-CM | POA: Diagnosis not present

## 2023-06-06 NOTE — Patient Instructions (Signed)
Medication Instructions:  Your physician recommends that you continue on your current medications as directed. Please refer to the Current Medication list given to you today.  *If you need a refill on your cardiac medications before your next appointment, please call your pharmacy*  Lab Work: None  Follow-Up: At [facility], you and your health needs are our priority.  As part of our continuing mission to provide you with exceptional heart care, we have created designated Provider Care Teams.  These Care Teams include your primary Cardiologist (physician) and Advanced Practice Providers (APPs -  Physician Assistants and Nurse Practitioners) who all work together to provide you with the care you need, when you need it.  Your next appointment:   12 month(s)  Provider:   Parke Poisson, MD

## 2023-06-06 NOTE — Telephone Encounter (Signed)
-----   Message from Parke Poisson sent at 06/06/2023  3:25 PM EST ----- Can you call this pt we saw earlier today and reminder her that this CT Was due last year? We forgot to discuss it today. GA

## 2023-06-06 NOTE — Telephone Encounter (Signed)
Spoke to rep at [facility] who stated the CT was ordered but pt never had it completed. They called her twice (left messages) but pt never returned phone call to get CT scheduled.  Pt will need to call [phone] to have it scheduled.   Called pt and informed her of the above.  She states she does not recall anything about needing a CT scan for VZ:DGLO nodule.  She is going to review her record and will call us back if needed.  Pt was given [facility] and [facility] numbers.

## 2023-06-06 NOTE — Progress Notes (Signed)
Cardiology Office Note:  .   Date:  06/06/2023  ID:  ALFA LEIBENSPERGER, DOB August 25, 1960, MRN [id number] PCP: Merri Brunette, MD  [facility] Providers Cardiologist:  Parke Poisson, MD    History of Present Illness: .   Marissa Martin is a 63 y.o. female.  Discussed the use of AI scribe software for clinical note transcription with the patient, who gave verbal consent to proceed.  History of Present Illness   The patient, with a history of CAD, coronary artery calcium score of 1011, hyperlipidemia, and obesity, has been stable with no anginal symptoms. The patient has mild to moderate mitral valve regurgitation and mild aortic valve regurgitation. Recently, the patient has gained weight and is planning to work on it. The patient has been experiencing allergy symptoms, particularly runny eyes, and has been taking Claritin for it.        ROS: negative except per HPI above.  Studies Reviewed: Marland Kitchen   EKG Interpretation Date/Time:  Tuesday June 06 2023 08:12:46 EST Ventricular Rate:  75 PR Interval:  154 QRS Duration:  86 QT Interval:  374 QTC Calculation: 417 R Axis:   39  Text Interpretation: Normal sinus rhythm Low voltage QRS Confirmed by Weston Brass ([id number]) on 06/06/2023 8:15:00 AM    Results   DIAGNOSTIC Echocardiogram: Stable mild to moderate mitral valve regurgitation, mild aortic valve regurgitation (06/04/2023)     Risk Assessment/Calculations:    Physical Exam:   VS:  BP (!) 142/72 (BP Location: Left Arm, Patient Position: Sitting, Cuff Size: Large)   Pulse 75   Ht 5\' 3"  (1.6 m)   Wt 262 lb 9.6 oz (119.1 kg)   SpO2 98%   BMI 46.52 kg/m    Wt Readings from Last 3 Encounters:  06/06/23 262 lb 9.6 oz (119.1 kg)  08/31/22 246 lb 9.6 oz (111.9 kg)  05/19/22 243 lb 6.4 oz (110.4 kg)     Physical Exam   CARDIOVASCULAR: Presence of a 2/6 systolic murmur at the apex noted. Heart and lungs auscultated with normal findings.     GEN: Well nourished, well  developed in no acute distress NECK: No JVD; No carotid bruits CARDIAC: RRR, no murmurs, rubs, gallops RESPIRATORY:  Clear to auscultation without rales, wheezing or rhonchi  ABDOMEN: Soft, non-tender, non-distended EXTREMITIES:  No edema; No deformity   ASSESSMENT AND PLAN: .    Assessment & Plan CAD in native artery  Mitral valve disorder  Hyperlipidemia, unspecified hyperlipidemia type  Elevated coronary artery calcium score  Lung nodule  Systolic murmur   Assessment and Plan    Coronary Artery Disease History of coronary artery calcium score of 1,011 and moderate nonobstructive CAD. No anginal symptoms. Discussed the benefits of statin therapy for secondary prevention of plaque formation, patient defers. -Continue to monitor for any chest pain, especially during exercise. -Consider statin therapy in the future when patient is ready. Consider PCKS9I therapy as well.  Mitral Valve Regurgitation Stable mild to moderate mitral valve regurgitation with no signs of heart weakness. -Continue to monitor every couple of years.  Hyperlipidemia Patient declined Repatha as discussed with clinical pharmacist. -Encourage patient to manage cholesterol levels through diet and exercise. -Consider re-evaluating cholesterol management strategies in the future.  Obesity Patient reports weight gain and plans to increase physical activity. -Encourage patient to continue with plans for increased physical activity and healthy diet. -Consider protein-rich, nutrient-dense foods to help manage weight and blood sugar levels.  Pulmonary Nodules Clustered nodular opacity in  the posterior left upper lobe and ground glass opacities in the posterior lingula on previous CT chest, likely infectious or inflammatory. -Order repeat CT chest to follow up on these findings.  General Health Maintenance -Consider carotid artery screening and abdominal aorta screening with PCP per patient preference. She  defers statin for secondary prevention.  -Continue to monitor cholesterol levels. -Schedule follow-up appointment in one year.

## 2023-06-06 NOTE — Telephone Encounter (Signed)
Spoke to pt regarding CT Chest wo Contrast ordered 08/31/2022, expected to be done 09/30/2022.  Pt states, "I'm pretty sure that I have already had that done.  I had two different CT's done and that one was at the [facility] on [address]."   Will ask triage nurse for help with this. Informed pt that I will call her back on 06/07/23 if needed.

## 2023-06-08 ENCOUNTER — Telehealth: Payer: Self-pay | Admitting: *Deleted

## 2023-06-08 DIAGNOSIS — I700 Atherosclerosis of aorta: Secondary | ICD-10-CM | POA: Diagnosis not present

## 2023-06-08 DIAGNOSIS — I10 Essential (primary) hypertension: Secondary | ICD-10-CM | POA: Diagnosis not present

## 2023-06-08 DIAGNOSIS — I2510 Atherosclerotic heart disease of native coronary artery without angina pectoris: Secondary | ICD-10-CM | POA: Diagnosis not present

## 2023-06-08 DIAGNOSIS — Z8249 Family history of ischemic heart disease and other diseases of the circulatory system: Secondary | ICD-10-CM | POA: Diagnosis not present

## 2023-06-08 NOTE — Telephone Encounter (Signed)
Pt originally call in today c/o Dull Headache at end of day on 06/07/23, around 7pm, she checked her BP, it was 187/118.  She rechecked it around 8pm, it was lower, and then around 10pm it had come down to 159/95. She stated her FIT bracelet logged her BP at 138/82, and her BP cuff logged it at 162/101. She also c/o feeling nauseated and today, on 06/08/23, her Headache had resolved, but nausea continues.  Dr. Jacques Navy was given this information who stated pt should be seen back in the office with APP.  Pt was then notified (via phone call) of Dr. Lupe Carney recommendations and she stated, "I just saw my PCP and she has restarted my Losartan."   Appointment with APP was offered for 06/09/23 at 3:35 pm, but pt declined.  Pt will get back in touch with [place] if symptoms persist or worsen.   Patient also asked if CT Chest was necessary, and Dr. Jacques Navy stated it had been recommended to be done in May 2024 and that she should have it done; pt advised to call GI for appointment at [phone].  She asked about cost; advised pt to call her [employer] and ask about cost/coverage.

## 2023-07-26 ENCOUNTER — Telehealth: Payer: Self-pay | Admitting: Internal Medicine

## 2023-07-26 NOTE — Telephone Encounter (Signed)
 Marissa Martin with GI called in stating they will have to cancel CT tomorrow due to no auth, please advise.

## 2023-07-27 ENCOUNTER — Ambulatory Visit
Admission: RE | Admit: 2023-07-27 | Discharge: 2023-07-27 | Disposition: A | Discharge status: Home / Self Care | Payer: BC Managed Care – PPO | Source: Ambulatory Visit | Attending: Internal Medicine | Admitting: Internal Medicine

## 2023-07-27 DIAGNOSIS — R911 Solitary pulmonary nodule: Secondary | ICD-10-CM | POA: Diagnosis not present

## 2023-08-11 ENCOUNTER — Encounter: Payer: Self-pay | Admitting: *Deleted

## 2023-08-28 IMAGING — US US ABDOMEN COMPLETE
1 series · 13 of 25 positions shown · non-contrast
Comparison: None.

CLINICAL DATA: Abdominal pain in a 60-year-old female.

EXAM:
ABDOMEN ULTRASOUND COMPLETE

[Series 1: us abdomen complete · 0.20mm/px · 13 of 126 slices shown]
[im 1/126]
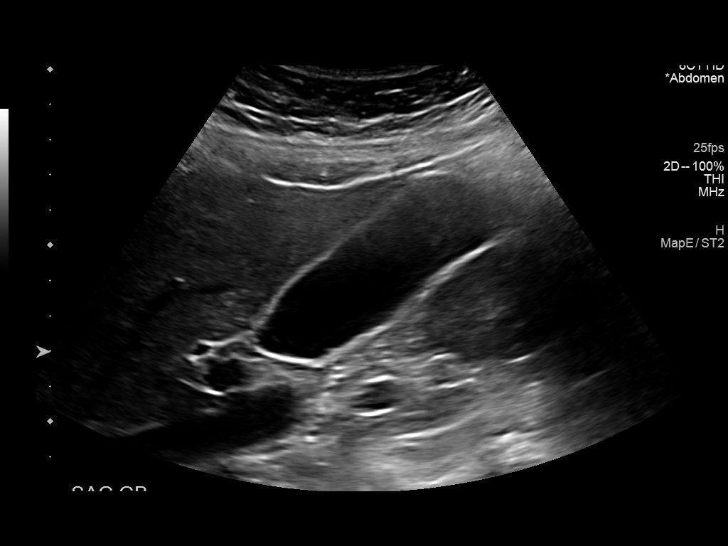
[im 11/126]
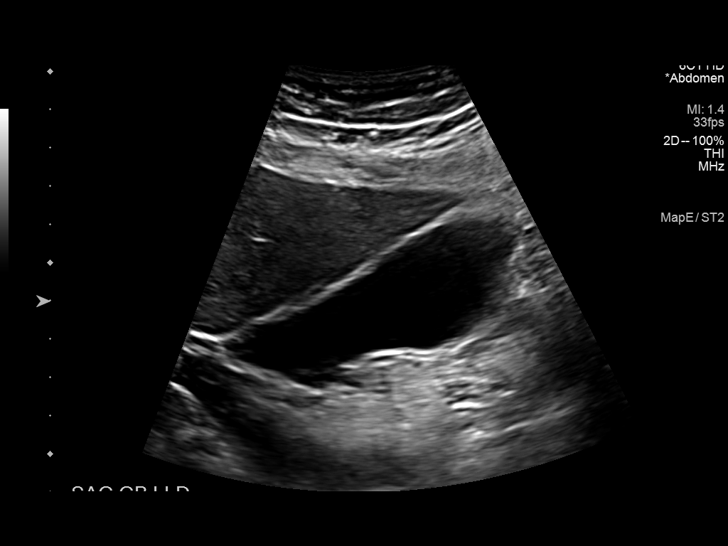
[im 21/126]
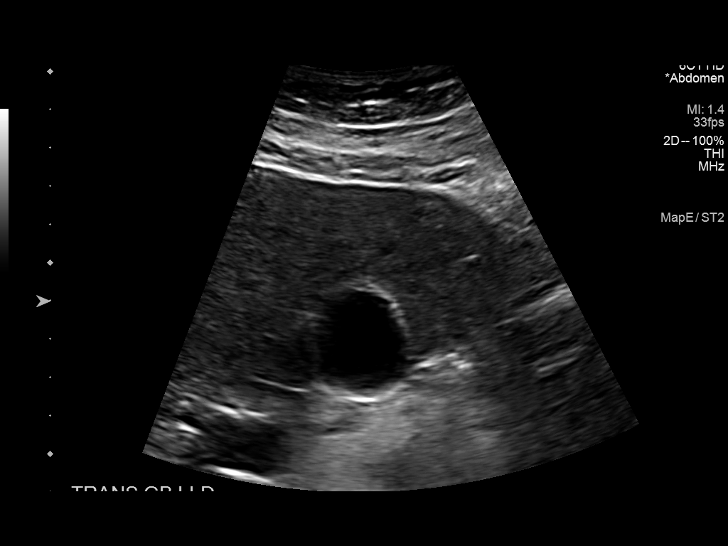
[im 32/126]
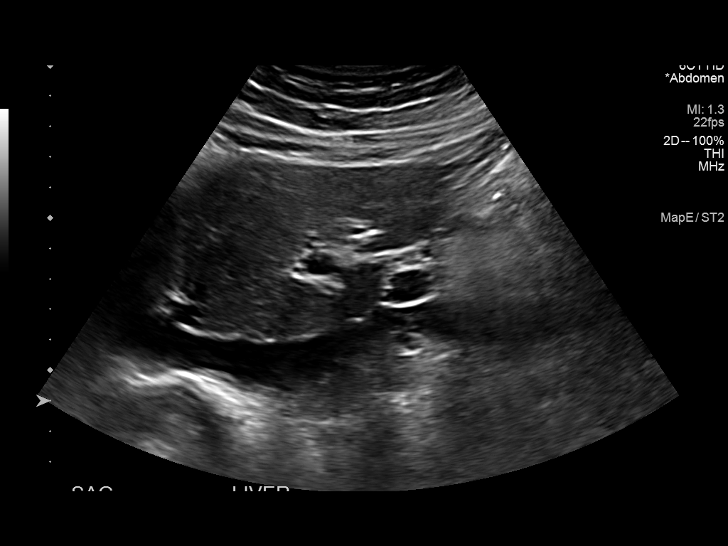
[im 42/126]
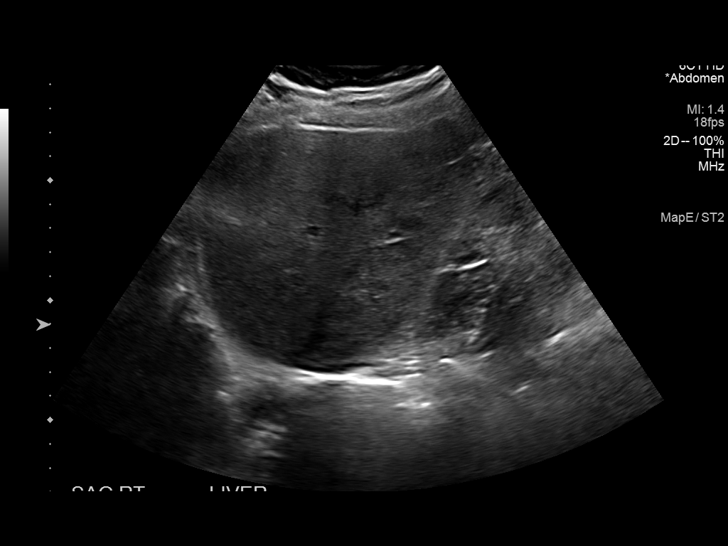
[im 53/126]
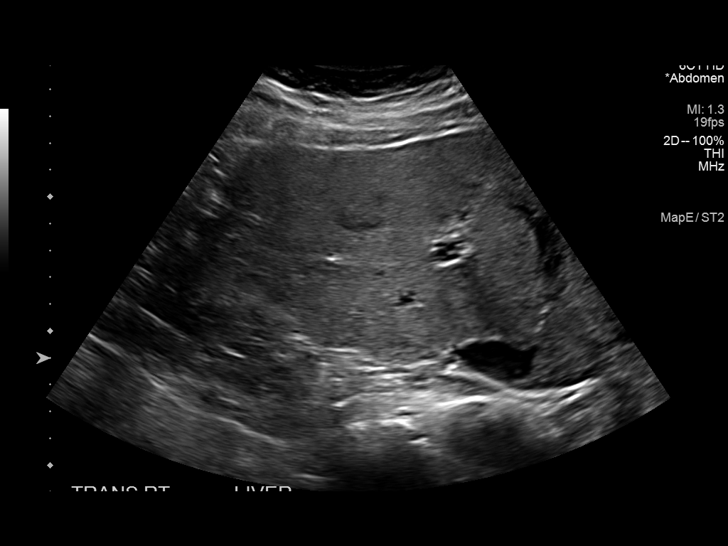
[im 63/126]
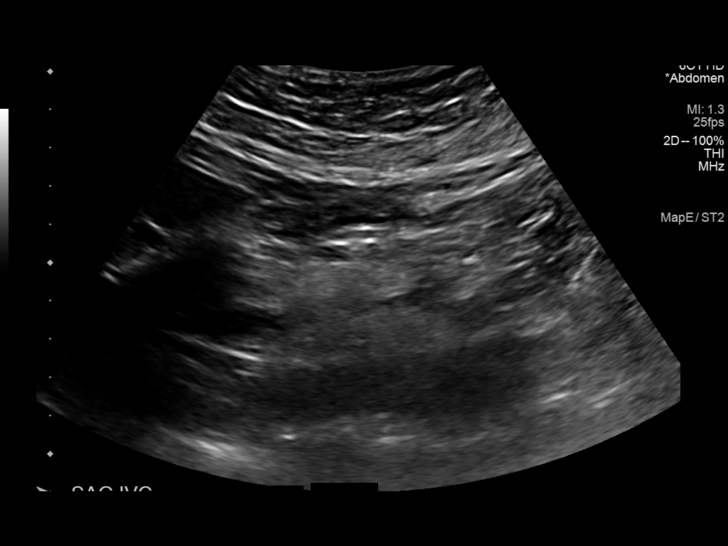
[im 73/126]
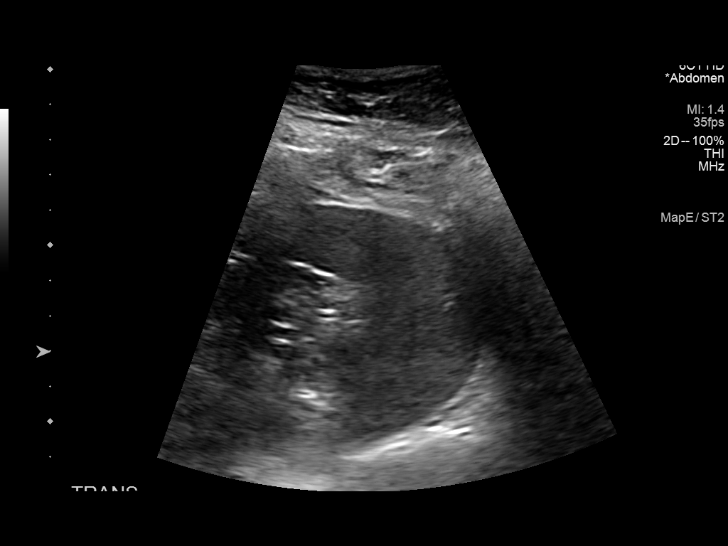
[im 84/126]
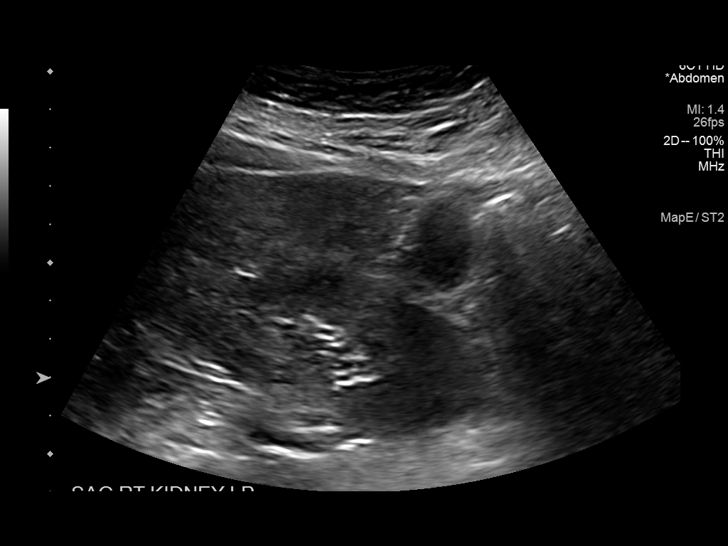
[im 94/126]
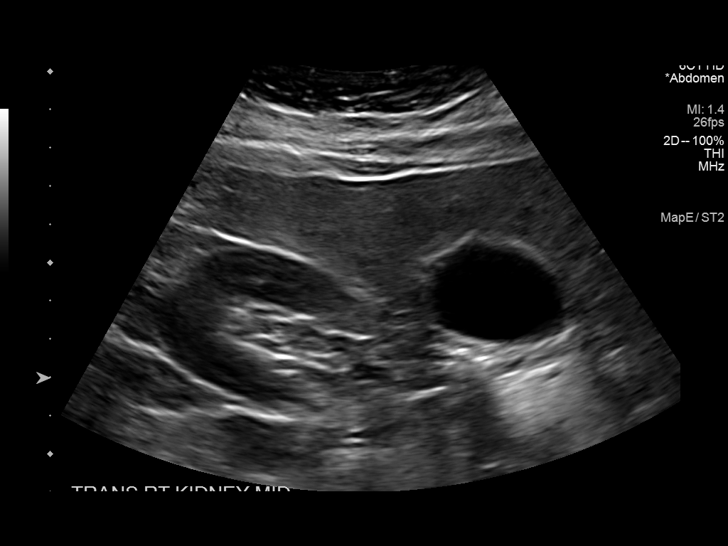
[im 105/126]
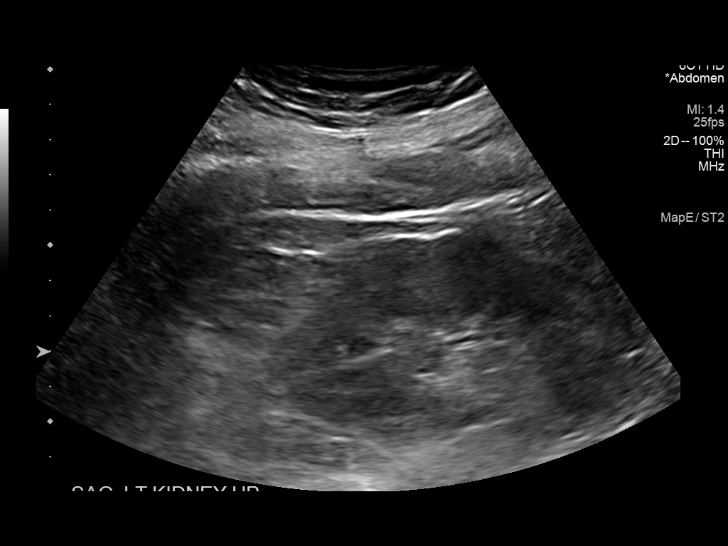
[im 115/126]
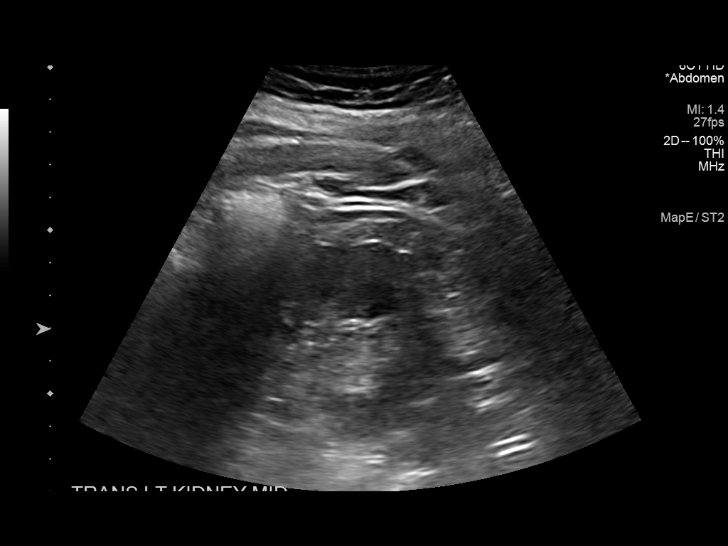
[im 126/126]
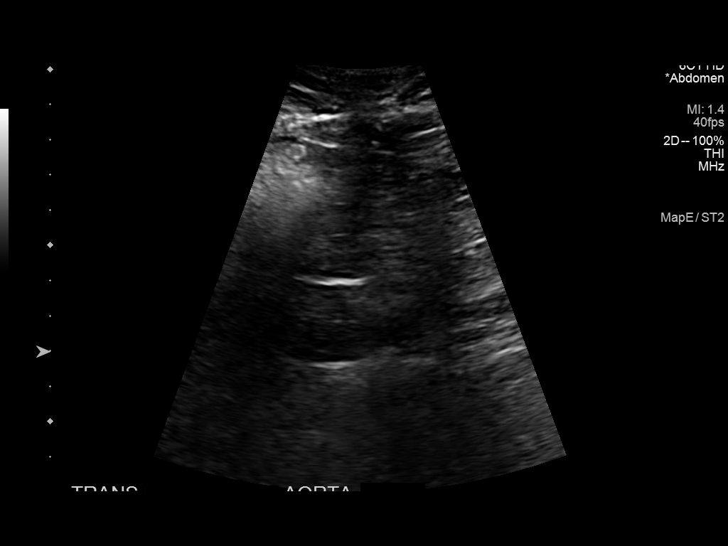

[13 of 25 positions shown; findings below may reference images not displayed]

FINDINGS: Gallbladder: No gallstones or wall thickening visualized. No
sonographic Murphy sign noted by sonographer.

Common bile duct: Diameter: 2.8 mm

Liver: No focal lesion identified. Within normal limits in
parenchymal echogenicity. Portal vein is patent on color Doppler
imaging with normal direction of blood flow towards the liver.

IVC: No abnormality visualized.

Pancreas: Visualized portion unremarkable.

Spleen: Size and appearance within normal limits.

Right Kidney: Length: 9.4 cm. No hydronephrosis. No visible
nephrolithiasis. Subtle area of ovoid appearing increased
echogenicity in the upper pole seen only on sagittal images (image
91 of 127) measuring up to 1.5 x 1.5 cm, subtle area also suggested
on image 95 of 127 on transverse images.

Left Kidney: Length: 9.8 cm. Echogenicity within normal limits. No
mass or hydronephrosis visualized.

Abdominal aorta: Atherosclerotic irregularity of the abdominal aorta
without dilation.

Other findings: No ascites
IMPRESSION: No acute findings in the abdomen by sonogram. Study mildly limited
by patient body habitus.

Focal area of increased echogenicity suggested in the upper pole the
RIGHT kidney does not allow for exclusion of solid renal neoplasm.
Would also correlate with any laboratory evidence of urinary tract
infection. Consider short interval follow-up in 6-8 weeks with CT to
exclude underlying lesion.

Suggestion of aortic atherosclerosis.

## 2023-10-27 DIAGNOSIS — E559 Vitamin D deficiency, unspecified: Secondary | ICD-10-CM | POA: Diagnosis not present

## 2023-10-27 DIAGNOSIS — I10 Essential (primary) hypertension: Secondary | ICD-10-CM | POA: Diagnosis not present

## 2023-10-27 DIAGNOSIS — E039 Hypothyroidism, unspecified: Secondary | ICD-10-CM | POA: Diagnosis not present

## 2023-10-27 DIAGNOSIS — E7800 Pure hypercholesterolemia, unspecified: Secondary | ICD-10-CM | POA: Diagnosis not present

## 2023-10-27 DIAGNOSIS — R7989 Other specified abnormal findings of blood chemistry: Secondary | ICD-10-CM | POA: Diagnosis not present

## 2023-10-27 LAB — LAB REPORT - SCANNED
EGFR: 52
TSH: 3.27 (ref 0.41–5.90)

## 2023-11-03 DIAGNOSIS — I10 Essential (primary) hypertension: Secondary | ICD-10-CM | POA: Diagnosis not present

## 2023-11-03 DIAGNOSIS — E7800 Pure hypercholesterolemia, unspecified: Secondary | ICD-10-CM | POA: Diagnosis not present

## 2023-11-03 DIAGNOSIS — Z780 Asymptomatic menopausal state: Secondary | ICD-10-CM | POA: Diagnosis not present

## 2023-11-03 DIAGNOSIS — E1159 Type 2 diabetes mellitus with other circulatory complications: Secondary | ICD-10-CM | POA: Diagnosis not present

## 2023-11-03 DIAGNOSIS — Z0000 Encounter for general adult medical examination without abnormal findings: Secondary | ICD-10-CM | POA: Diagnosis not present

## 2023-11-27 ENCOUNTER — Ambulatory Visit: Payer: Self-pay | Admitting: Internal Medicine

## 2023-11-28 DIAGNOSIS — H0288B Meibomian gland dysfunction left eye, upper and lower eyelids: Secondary | ICD-10-CM | POA: Diagnosis not present

## 2023-11-28 DIAGNOSIS — H1045 Other chronic allergic conjunctivitis: Secondary | ICD-10-CM | POA: Diagnosis not present

## 2023-11-28 DIAGNOSIS — H0288A Meibomian gland dysfunction right eye, upper and lower eyelids: Secondary | ICD-10-CM | POA: Diagnosis not present

## 2023-12-12 DIAGNOSIS — L720 Epidermal cyst: Secondary | ICD-10-CM | POA: Diagnosis not present

## 2023-12-27 DIAGNOSIS — H0288A Meibomian gland dysfunction right eye, upper and lower eyelids: Secondary | ICD-10-CM | POA: Diagnosis not present

## 2023-12-27 DIAGNOSIS — H2513 Age-related nuclear cataract, bilateral: Secondary | ICD-10-CM | POA: Diagnosis not present

## 2023-12-27 DIAGNOSIS — H5213 Myopia, bilateral: Secondary | ICD-10-CM | POA: Diagnosis not present

## 2023-12-27 DIAGNOSIS — H52223 Regular astigmatism, bilateral: Secondary | ICD-10-CM | POA: Diagnosis not present

## 2023-12-27 DIAGNOSIS — H0288B Meibomian gland dysfunction left eye, upper and lower eyelids: Secondary | ICD-10-CM | POA: Diagnosis not present

## 2023-12-27 DIAGNOSIS — H524 Presbyopia: Secondary | ICD-10-CM | POA: Diagnosis not present

## 2023-12-27 DIAGNOSIS — H1045 Other chronic allergic conjunctivitis: Secondary | ICD-10-CM | POA: Diagnosis not present

## 2024-01-05 DIAGNOSIS — D123 Benign neoplasm of transverse colon: Secondary | ICD-10-CM | POA: Diagnosis not present

## 2024-01-05 DIAGNOSIS — K5730 Diverticulosis of large intestine without perforation or abscess without bleeding: Secondary | ICD-10-CM | POA: Diagnosis not present

## 2024-01-05 DIAGNOSIS — Z1211 Encounter for screening for malignant neoplasm of colon: Secondary | ICD-10-CM | POA: Diagnosis not present

## 2024-01-31 LAB — LAB REPORT - SCANNED
A1c: 6.6
EGFR: 57

## 2024-02-09 DIAGNOSIS — I700 Atherosclerosis of aorta: Secondary | ICD-10-CM | POA: Diagnosis not present

## 2024-02-09 DIAGNOSIS — I10 Essential (primary) hypertension: Secondary | ICD-10-CM | POA: Diagnosis not present

## 2024-02-09 DIAGNOSIS — E559 Vitamin D deficiency, unspecified: Secondary | ICD-10-CM | POA: Diagnosis not present

## 2024-02-09 DIAGNOSIS — I2510 Atherosclerotic heart disease of native coronary artery without angina pectoris: Secondary | ICD-10-CM | POA: Diagnosis not present

## 2024-02-10 ENCOUNTER — Ambulatory Visit: Payer: Self-pay | Admitting: Internal Medicine

## 2024-05-07 ENCOUNTER — Encounter: Payer: Self-pay | Admitting: *Deleted
